# Patient Record
Sex: Female | Born: 1990 | Race: White | Hispanic: No | Marital: Married | Smoking: Never smoker
Health system: Southern US, Community
[De-identification: ages and names within clinical notes are randomized; demographics above are authoritative.]

## PROBLEM LIST (undated history)

## (undated) DIAGNOSIS — Z789 Other specified health status: Secondary | ICD-10-CM

## (undated) HISTORY — PX: NO PAST SURGERIES: SHX2092

---

## 2018-07-13 ENCOUNTER — Encounter: Payer: Self-pay | Admitting: Certified Nurse Midwife

## 2018-07-13 ENCOUNTER — Ambulatory Visit (INDEPENDENT_AMBULATORY_CARE_PROVIDER_SITE_OTHER): Payer: BLUE CROSS/BLUE SHIELD | Admitting: Certified Nurse Midwife

## 2018-07-13 VITALS — BP 102/59 | HR 68 | Ht 68.0 in | Wt 129.1 lb

## 2018-07-13 DIAGNOSIS — Z3041 Encounter for surveillance of contraceptive pills: Secondary | ICD-10-CM | POA: Diagnosis not present

## 2018-07-13 DIAGNOSIS — Z01419 Encounter for gynecological examination (general) (routine) without abnormal findings: Secondary | ICD-10-CM | POA: Diagnosis not present

## 2018-07-13 MED ORDER — OCELLA 3-0.03 MG PO TABS: 1.0000 | ORAL_TABLET | Freq: Every day | ORAL | 4 refills | Status: DC

## 2018-07-13 NOTE — Patient Instructions (Addendum)
Preparing for Pregnancy If you are considering becoming pregnant, make an appointment to see your regular health care provider to learn how to prepare for a safe and healthy pregnancy (preconception care). During a preconception care visit, your health care provider will:  Do a complete physical exam, including a Pap test.  Take a complete medical history.  Give you information, answer your questions, and help you resolve problems.  Preconception checklist Medical history  Tell your health care provider about any current or past medical conditions. Your pregnancy or your ability to become pregnant may be affected by chronic conditions, such as diabetes, chronic hypertension, and thyroid problems.  Include your family's medical history as well as your partner's medical history.  Tell your health care provider about any history of STIs (sexually transmitted infections).These can affect your pregnancy. In some cases, they can be passed to your baby. Discuss any concerns that you have about STIs.  If indicated, discuss the benefits of genetic testing. This testing will show whether there are any genetic conditions that may be passed from you or your partner to your baby.  Tell your health care provider about: ? Any problems you have had with conception or pregnancy. ? Any medicines you take. These include vitamins, herbal supplements, and over-the-counter medicines. ? Your history of immunizations. Discuss any vaccinations that you may need.  Diet  Ask your health care provider what to include in a healthy diet that has a balance of nutrients. This is especially important when you are pregnant or preparing to become pregnant.  Ask your health care provider to help you reach a healthy weight before pregnancy. ? If you are overweight, you may be at higher risk for certain complications, such as high blood pressure, diabetes, and preterm birth. ? If you are underweight, you are more likely  to have a baby who has a low birth weight.  Lifestyle, work, and home  Let your health care provider know: ? About any lifestyle habits that you have, such as alcohol use, drug use, or smoking. ? About recreational activities that may put you at risk during pregnancy, such as downhill skiing and certain exercise programs. ? Tell your health care provider about any international travel, especially any travel to places with an active [place] virus outbreak. ? About harmful substances that you may be exposed to at work or at home. These include chemicals, pesticides, radiation, or even litter boxes. ? If you do not feel safe at home.  Mental health  Tell your health care provider about: ? Any history of mental health conditions, including feelings of depression, sadness, or anxiety. ? Any medicines that you take for a mental health condition. These include herbs and supplements.  Home instructions to prepare for pregnancy Lifestyle  Eat a balanced diet. This includes fresh fruits and vegetables, whole grains, lean meats, low-fat dairy products, healthy fats, and foods that are high in fiber. Ask to meet with a nutritionist or registered dietitian for assistance with meal planning and goals.  Get regular exercise. Try to be active for at least 30 minutes a day on most days of the week. Ask your health care provider which activities are safe during pregnancy.  Do not use any products that contain nicotine or tobacco, such as cigarettes and e-cigarettes. If you need help quitting, ask your health care provider.  Do not drink alcohol.  Do not take illegal drugs.  Maintain a healthy weight. Ask your health care provider what weight range is  right for you.  General instructions  Keep an accurate record of your menstrual periods. This makes it easier for your health care provider to determine your baby's due date.  Begin taking prenatal vitamins and folic acid supplements daily as directed by  your health care provider.  Manage any chronic conditions, such as high blood pressure and diabetes, as told by your health care provider. This is important.  How do I know that I am pregnant? You may be pregnant if you have been sexually active and you miss your period. Symptoms of early pregnancy include:  Mild cramping.  Very light vaginal bleeding (spotting).  Feeling unusually tired.  Nausea and vomiting (morning sickness).  If you have any of these symptoms and you suspect that you might be pregnant, you can take a home pregnancy test. These tests check for a hormone in your urine (human chorionic gonadotropin, or hCG). A woman's body begins to make this hormone during early pregnancy. These tests are very accurate. Wait until at least the first day after you miss your period to take one. If the test shows that you are pregnant (you get a positive result), call your health care provider to make an appointment for prenatal care. What should I do if I become pregnant?  Make an appointment with your health care provider as soon as you suspect you are pregnant.  Do not use any products that contain nicotine, such as cigarettes, chewing tobacco, and e-cigarettes. If you need help quitting, ask your health care provider.  Do not drink alcoholic beverages. Alcohol is related to a number of birth defects.  Avoid toxic odors and chemicals.  You may continue to have sexual intercourse if it does not cause pain or other problems, such as vaginal bleeding. This information is not intended to replace advice given to you by your health care provider. Make sure you discuss any questions you have with your health care provider. Document Released: 11/17/2008 Document Revised: 08/02/2016 Document Reviewed: 06/26/2016 Elsevier Interactive Patient Education  2018 Elsevier Inc.  Preventive Care 18-39 Years, Female Preventive care refers to lifestyle choices and visits with your health care provider  that can promote health and wellness. What does preventive care include?  A yearly physical exam. This is also called an annual well check.  Dental exams once or twice a year.  Routine eye exams. Ask your health care provider how often you should have your eyes checked.  Personal lifestyle choices, including: ? Daily care of your teeth and gums. ? Regular physical activity. ? Eating a healthy diet. ? Avoiding tobacco and drug use. ? Limiting alcohol use. ? Practicing safe sex. ? Taking vitamin and mineral supplements as recommended by your health care provider. What happens during an annual well check? The services and screenings done by your health care provider during your annual well check will depend on your age, overall health, lifestyle risk factors, and family history of disease. Counseling Your health care provider may ask you questions about your:  Alcohol use.  Tobacco use.  Drug use.  Emotional well-being.  Home and relationship well-being.  Sexual activity.  Eating habits.  Work and work environment.  Method of birth control.  Menstrual cycle.  Pregnancy history.  Screening You may have the following tests or measurements:  Height, weight, and BMI.  Diabetes screening. This is done by checking your blood sugar (glucose) after you have not eaten for a while (fasting).  Blood pressure.  Lipid and cholesterol levels. These   be checked every 5 years starting at age 61.  Skin check.  Hepatitis C blood test.  Hepatitis B blood test.  Sexually transmitted disease (STD) testing.  BRCA-related cancer screening. This may be done if you have a family history of breast, ovarian, tubal, or peritoneal cancers.  Pelvic exam and Pap test. This may be done every 3 years starting at age 53. Starting at age 5, this may be done every 5 years if you have a Pap test in combination with an HPV test.  Discuss your test results, treatment options, and if  necessary, the need for more tests with your health care provider. Vaccines Your health care provider may recommend certain vaccines, such as:  Influenza vaccine. This is recommended every year.  Tetanus, diphtheria, and acellular pertussis (Tdap, Td) vaccine. You may need a Td booster every 10 years.  Varicella vaccine. You may need this if you have not been vaccinated.  HPV vaccine. If you are 24 or younger, you may need three doses over 6 months.  Measles, mumps, and rubella (MMR) vaccine. You may need at least one dose of MMR. You may also need a second dose.  Pneumococcal 13-valent conjugate (PCV13) vaccine. You may need this if you have certain conditions and were not previously vaccinated.  Pneumococcal polysaccharide (PPSV23) vaccine. You may need one or two doses if you smoke cigarettes or if you have certain conditions.  Meningococcal vaccine. One dose is recommended if you are age 89-21 years and a first-year college student living in a residence hall, or if you have one of several medical conditions. You may also need additional booster doses.  Hepatitis A vaccine. You may need this if you have certain conditions or if you travel or work in places where you may be exposed to hepatitis A.  Hepatitis B vaccine. You may need this if you have certain conditions or if you travel or work in places where you may be exposed to hepatitis B.  Haemophilus influenzae type b (Hib) vaccine. You may need this if you have certain risk factors.  Talk to your health care provider about which screenings and vaccines you need and how often you need them. This information is not intended to replace advice given to you by your health care provider. Make sure you discuss any questions you have with your health care provider. Document Released: 01/31/2002 Document Revised: 08/24/2016 Document Reviewed: 10/06/2015 Elsevier Interactive Patient Education  2018 [place]; Ethinyl  Estradiol tablets What is this medicine? DROSPIRENONE; ETHINYL ESTRADIOL (dro SPY re nown; ETH in il es tra DYE ole) is an oral contraceptive (birth control pill). This medicine combines two types of female hormones, an estrogen and a progestin. It is used to prevent ovulation and pregnancy. This medicine may be used for other purposes; ask your health care provider or pharmacist if you have questions. COMMON BRAND NAME(S): Glenis Smoker What should I tell my health care provider before I take this medicine? They need to know if you have or ever had any of these conditions: -abnormal vaginal bleeding -adrenal gland disease -blood vessel disease or blood clots -breast, cervical, endometrial, ovarian, liver, or uterine cancer -diabetes -gallbladder disease -heart disease or recent heart attack -high blood pressure -high cholesterol -high potassium level -kidney disease -liver disease -migraine headaches -stroke -systemic lupus erythematosus (SLE) -tobacco smoker -an unusual or allergic reaction to estrogens, progestins, or other medicines, foods, dyes, or preservatives -pregnant or trying  to get pregnant -breast-feeding How should I use this medicine? Take this medicine by mouth. To reduce nausea, this medicine may be taken with food. Follow the directions on the prescription label. Take this medicine at the same time each day and in the order directed on the package. Do not take your medicine more often than directed. A patient package insert for the product will be given with each prescription and refill. Read this sheet carefully each time. The sheet may change frequently. Talk to your pediatrician regarding the use of this medicine in children. Special care may be needed. This medicine has been used in female children who have started having menstrual periods. Overdosage: If you think you have taken too much of this medicine  contact a poison control center or emergency room at once. NOTE: This medicine is only for you. Do not share this medicine with others. What if I miss a dose? If you miss a dose, refer to the patient information sheet you received with your medicine for direction. If you miss more than one pill, this medicine may not be as effective and you may need to use another form of birth control. What may interact with this medicine? Do not take this medicine with any of the following medications: -aminoglutethimide -amprenavir, fosamprenavir -atazanavir; cobicistat -anastrozole -bosentan -exemestane -letrozole -metyrapone -testolactone This medicine may also interact with the following medications: -acetaminophen -antiviral medicines for HIV or AIDS -aprepitant -barbiturates -certain antibiotics like rifampin, rifabutin, rifapentine, and possibly penicillins or tetracyclines -certain diuretics like amiloride, spironolactone, triamterene -certain medicines for fungal infections like griseofulvin, ketoconazole, itraconazole -certain medications for high blood pressure or heart conditions like ACE-inhibitors, Angiotensin-II receptor blockers, eplerenone -certain medicines for seizures like carbamazepine, oxcarbazepine, phenobarbital, phenytoin -cholestyramine -cobicistat -corticosteroid like hydrocortisone and prednisolone -cyclosporine -dantrolene -felbamate -grapefruit juice -heparin -lamotrigine -medicines for diabetes, including pioglitazone -modafinil -NSAIDs -potassium supplements -pyrimethamine -raloxifene -St. John's wort -sulfasalazine -tamoxifen -topiramate -thyroid hormones -warfarin his list may not describe all possible interactions. Give your health care provider a list of all the medicines, herbs, non-prescription drugs, or dietary supplements you use. Also tell them if you smoke, drink alcohol, or use illegal drugs. Some items may interact with your medicine. This  list may not describe all possible interactions. Give your health care provider a list of all the medicines, herbs, non-prescription drugs, or dietary supplements you use. Also tell them if you smoke, drink alcohol, or use illegal drugs. Some items may interact with your medicine. What should I watch for while using this medicine? Visit your doctor or health care professional for regular checks on your progress. You will need a regular breast and pelvic exam and Pap smear while on this medicine. Use an additional method of contraception during the first cycle that you take these tablets. If you have any reason to think you are pregnant, stop taking this medicine right away and contact your doctor or health care professional. If you are taking this medicine for hormone related problems, it may take several cycles of use to see improvement in your condition. Smoking increases the risk of getting a blood clot or having a stroke while you are taking birth control pills, especially if you are more than 26 years old. You are strongly advised not to smoke. This medicine can make your body retain fluid, making your fingers, hands, or ankles swell. Your blood pressure can go up. Contact your doctor or health care professional if you feel you are retaining fluid. This medicine can make you  more sensitive to the sun. Keep out of the sun. If you cannot avoid being in the sun, wear protective clothing and use sunscreen. Do not use sun lamps or tanning beds/booths. If you wear contact lenses and notice visual changes, or if the lenses begin to feel uncomfortable, consult your eye care specialist. In some women, tenderness, swelling, or minor bleeding of the gums may occur. Notify your dentist if this happens. Brushing and flossing your teeth regularly may help limit this. See your dentist regularly and inform your dentist of the medicines you are taking. If you are going to have elective surgery, you may need to stop  taking this medicine before the surgery. Consult your health care professional for advice. This medicine does not protect you against HIV infection (AIDS) or any other sexually transmitted diseases. What side effects may I notice from receiving this medicine? Side effects that you should report to your doctor or health care professional as soon as possible: -allergic reactions like skin rash, itching or hives, swelling of the face, lips, or tongue -breast tissue changes or discharge -changes in vision -chest pain -confusion, trouble speaking or understanding -dark urine -general ill feeling or flu-like symptoms -light-colored stools -nausea, vomiting -pain, swelling, warmth in the leg -right upper belly pain -severe headaches -shortness of breath -sudden numbness or weakness of the face, arm or leg -trouble walking, dizziness, loss of balance or coordination -unusual vaginal bleeding -yellowing of the eyes or skin Side effects that usually do not require medical attention (report to your doctor or health care professional if they continue or are bothersome): -acne -brown spots on the face -change in appetite -change in sexual desire -depressed mood or mood swings -fluid retention and swelling -stomach cramps or bloating -unusually weak or tired -weight gain This list may not describe all possible side effects. Call your doctor for medical advice about side effects. You may report side effects to FDA at 1-800-FDA-1088. Where should I keep my medicine? Keep out of the reach of children. Store at room temperature between 15 and 30 degrees C (59 and 86 degrees F). Throw away any unused medicine after the expiration date. NOTE: This sheet is a summary. It may not cover all possible information. If you have questions about this medicine, talk to your doctor, pharmacist, or health care provider.  2018 Elsevier/Gold Standard (2016-08-26 13:52:56)

## 2018-07-13 NOTE — Progress Notes (Signed)
ANNUAL PREVENTATIVE CARE GYN  ENCOUNTER NOTE  Subjective:       Wanda Sanchez is a 27 y.o. [id number] female here for a routine annual gynecologic exam. No current complaints. Here to establish care.   [place]ocated from [place] with husband and one (531) year old daughter. Works at [employer].   Denies difficulty breathing or respiratory distress, chest pain, abdominal pain, excessive vaginal bleeding, dysuria and leg pain or swelling.     Gynecologic History  Patient's last menstrual period was 06/30/2018 (exact date). Period Cycle (Days): 28 Period Duration (Days): Five (5) Period Pattern: Regular Menstrual Flow: Moderate Menstrual Control: Tampon Menstrual Control Change Freq (Hours): Four (4) to five (5) Dysmenorrhea: None  Contraception: OCP (estrogen/progesterone)  Last Pap: 05/2017. Results were: normal  Obstetric History  OB History  Gravida Para Term Preterm AB Living  1 1 1     1   SAB TAB Ectopic Multiple Live Births          1    # Outcome Date GA Lbr Len/2nd Weight Sex Delivery Anes PTL Lv  1 Term 2017    F Vag-Spont  N LIV   No Known Allergies  Social History   Socioeconomic History  . Marital status: Married    Spouse name: Not on file  . Number of children: Not on file  . Years of education: Not on file  . Highest education level: Not on file  Occupational History  . Not on file  Social Needs  . Financial resource strain: Not on file  . Food insecurity:    Worry: Not on file    Inability: Not on file  . Transportation needs:    Medical: Not on file    Non-medical: Not on file  Tobacco Use  . Smoking status: Never Smoker  . Smokeless tobacco: Never Used  Substance and Sexual Activity  . Alcohol use: Yes    Comment: occas  . Drug use: Never  . Sexual activity: Yes    Birth control/protection: Pill  Lifestyle  . Physical activity:    Days per week: Not on file    Minutes per session: Not on file  . Stress: Not on file   Relationships  . Social connections:    Talks on phone: Not on file    Gets together: Not on file    Attends religious service: Not on file    Active member of club or organization: Not on file    Attends meetings of clubs or organizations: Not on file    Relationship status: Not on file  . Intimate partner violence:    Fear of current or ex partner: Not on file    Emotionally abused: Not on file    Physically abused: Not on file    Forced sexual activity: Not on file  Other Topics Concern  . Not on file  Social History Narrative  . Not on file    Family History  Problem Relation Age of Onset  . Breast cancer Maternal Aunt   . Breast cancer Paternal Aunt   . Cancer Maternal Grandfather     The following portions of the patient's history were reviewed and updated as appropriate: allergies, current medications, past family history, past medical history, past social history, past surgical history and problem list.  Review of Systems  ROS negative except as noted above. Information obtained from patient.    Objective:   BP (!) 102/59   Pulse 68   Ht 5'  8" (1.727 m)   Wt 129 lb 1 oz (58.5 kg)   LMP 06/30/2018 (Exact Date)   BMI 19.62 kg/m    CONSTITUTIONAL: Well-developed, well-nourished female in no acute distress.   PSYCHIATRIC: Normal mood and affect. Normal behavior. Normal judgment and thought content.  NEUROLGIC: Alert and oriented to person, place, and time. Normal muscle tone coordination. No cranial nerve deficit noted.  HENT:  Normocephalic, atraumatic, External right and left ear normal.   EYES: Conjunctivae and EOM are normal. Pupils are equal and round.    NECK: Normal range of motion, supple, no masses.  Normal thyroid.   SKIN: Skin is warm and dry. No rash noted. Not diaphoretic. No erythema. No pallor.  CARDIOVASCULAR: Normal heart rate noted, regular  rhythm, no murmur.  RESPIRATORY: Clear to auscultation bilaterally. Effort and breath sounds  normal, no problems with respiration noted.  BREASTS: Symmetric in size. No masses, skin changes, nipple drainage, or lymphadenopathy.  ABDOMEN: Soft, normal bowel sounds, no distention noted.  No tenderness, rebound or guarding.   PELVIC:  External Genitalia: Normal  Vagina: Normal  Cervix: Normal  Uterus: Normal  Adnexa: Normal  MUSCULOSKELETAL: Normal range of motion. No tenderness.  No cyanosis, clubbing, or edema.  2+ distal pulses.  LYMPHATIC: No Axillary, Supraclavicular, or Inguinal  Adenopathy.  Assessment:   Annual gynecologic examination 27 y.o.   Contraception: OCP (estrogen/progesterone)   Normal BMI   Problem List Items Addressed This Visit    None    Visit Diagnoses    Well woman exam    -  Primary      Plan:   Pap: Not needed  Labs: Declined  Routine preventative health maintenance measures emphasized: Exercise/Diet/Weight control, Tobacco Warnings, Alcohol/Substance use risks, Stress Management and Peer Pressure Issues; see AVS  Rx: Ocella, see orders  Reviewed red flag symptoms and when to call  RTC x 1 year for Annual Exam or sooner if needed   Gunnar Bulla, CNM [facility], [facility]

## 2018-07-13 NOTE — Progress Notes (Signed)
New pt is here for an annual exam. LPS 2018 WNL.

## 2018-07-16 DIAGNOSIS — Z789 Other specified health status: Secondary | ICD-10-CM | POA: Insufficient documentation

## 2018-07-31 ENCOUNTER — Ambulatory Visit (INDEPENDENT_AMBULATORY_CARE_PROVIDER_SITE_OTHER): Payer: BLUE CROSS/BLUE SHIELD | Admitting: Certified Nurse Midwife

## 2018-07-31 VITALS — BP 110/61 | HR 85 | Ht 68.0 in | Wt 129.3 lb

## 2018-07-31 DIAGNOSIS — F3289 Other specified depressive episodes: Secondary | ICD-10-CM

## 2018-07-31 NOTE — Patient Instructions (Signed)
Ethinyl Estradiol; Norethindrone Acetate; Ferrous fumarate tablets or capsules What is this medicine? ETHINYL ESTRADIOL; NORETHINDRONE ACETATE; FERROUS FUMARATE (ETH in il es tra DYE ole; nor eth IN drone AS e tate; FER us FUE ma rate) is an oral contraceptive. The products combine two types of female hormones, an estrogen and a progestin. They are used to prevent ovulation and pregnancy. Some products are also used to treat acne in females. This medicine may be used for other purposes; ask your health care provider or pharmacist if you have questions. COMMON BRAND NAME(S): Blisovi 24 Fe, Blisovi Fe, Estrostep Fe, Gildess 24 Fe, Gildess Fe 1.5/30, Gildess Fe 1/20, Junel Fe 1.5/30, Junel Fe 1/20, Junel Fe 24, Larin Fe, Lo Loestrin Fe, Loestrin 24 Fe, Loestrin FE 1.5/30, Loestrin FE 1/20, Lomedia 24 Fe, Microgestin 24 Fe, Microgestin Fe 1.5/30, Microgestin Fe 1/20, Tarina Fe 1/20, Taytulla, Tilia Fe, Tri-Legest Fe What should I tell my health care provider before I take this medicine? They need to know if you have any of these conditions: -abnormal vaginal bleeding -blood vessel disease -breast, cervical, endometrial, ovarian, liver, or uterine cancer -diabetes -gallbladder disease -heart disease or recent heart attack -high blood pressure -high cholesterol -history of blood clots -kidney disease -liver disease -migraine headaches -smoke tobacco -stroke -systemic lupus erythematosus (SLE) -an unusual or allergic reaction to estrogens, progestins, other medicines, foods, dyes, or preservatives -pregnant or trying to get pregnant -breast-feeding How should I use this medicine? Take this medicine by mouth. To reduce nausea, this medicine may be taken with food. Follow the directions on the prescription label. Take this medicine at the same time each day and in the order directed on the package. Do not take your medicine more often than directed. A patient package insert for the product will be  given with each prescription and refill. Read this sheet carefully each time. The sheet may change frequently. Contact your pediatrician regarding the use of this medicine in children. Special care may be needed. This medicine has been used in female children who have started having menstrual periods. Overdosage: If you think you have taken too much of this medicine contact a poison control center or emergency room at once. NOTE: This medicine is only for you. Do not share this medicine with others. What if I miss a dose? If you miss a dose, refer to the patient information sheet you received with your medicine for direction. If you miss more than one pill, this medicine may not be as effective and you may need to use another form of birth control. What may interact with this medicine? Do not take this medicine with the following medication: -dasabuvir; ombitasvir; paritaprevir; ritonavir -ombitasvir; paritaprevir; ritonavir This medicine may also interact with the following medications: -acetaminophen -antibiotics or medicines for infections, especially rifampin, rifabutin, rifapentine, and griseofulvin, and possibly penicillins or tetracyclines -aprepitant -ascorbic acid (vitamin C) -atorvastatin -barbiturate medicines, such as phenobarbital -bosentan -carbamazepine -caffeine -clofibrate -cyclosporine -dantrolene -doxercalciferol -felbamate -grapefruit juice -hydrocortisone -medicines for anxiety or sleeping problems, such as diazepam or temazepam -medicines for diabetes, including pioglitazone -mineral oil -modafinil -mycophenolate -nefazodone -oxcarbazepine -phenytoin -prednisolone -ritonavir or other medicines for HIV infection or AIDS -rosuvastatin -selegiline -soy isoflavones supplements -St. John's wort -tamoxifen or raloxifene -theophylline -thyroid hormones -topiramate -warfarin This list may not describe all possible interactions. Give your health care  provider a list of all the medicines, herbs, non-prescription drugs, or dietary supplements you use. Also tell them if you smoke, drink alcohol, or use illegal drugs. Some   items may interact with your medicine. What should I watch for while using this medicine? Visit your doctor or health care professional for regular checks on your progress. You will need a regular breast and pelvic exam and Pap smear while on this medicine. Use an additional method of contraception during the first cycle that you take these tablets. If you have any reason to think you are pregnant, stop taking this medicine right away and contact your doctor or health care professional. If you are taking this medicine for hormone related problems, it may take several cycles of use to see improvement in your condition. Smoking increases the risk of getting a blood clot or having a stroke while you are taking birth control pills, especially if you are more than 26 years old. You are strongly advised not to smoke. This medicine can make your body retain fluid, making your fingers, hands, or ankles swell. Your blood pressure can go up. Contact your doctor or health care professional if you feel you are retaining fluid. This medicine can make you more sensitive to the sun. Keep out of the sun. If you cannot avoid being in the sun, wear protective clothing and use sunscreen. Do not use sun lamps or tanning beds/booths. If you wear contact lenses and notice visual changes, or if the lenses begin to feel uncomfortable, consult your eye care specialist. In some women, tenderness, swelling, or minor bleeding of the gums may occur. Notify your dentist if this happens. Brushing and flossing your teeth regularly may help limit this. See your dentist regularly and inform your dentist of the medicines you are taking. If you are going to have elective surgery, you may need to stop taking this medicine before the surgery. Consult your health care  professional for advice. This medicine does not protect you against HIV infection (AIDS) or any other sexually transmitted diseases. What side effects may I notice from receiving this medicine? Side effects that you should report to your doctor or health care professional as soon as possible: -allergic reactions like skin rash, itching or hives, swelling of the face, lips, or tongue -breast tissue changes or discharge -changes in vaginal bleeding during your period or between your periods -changes in vision -chest pain -confusion -coughing up blood -dizziness -feeling faint or lightheaded -headaches or migraines -leg, arm or groin pain -loss of balance or coordination -severe or sudden headaches -stomach pain (severe) -sudden shortness of breath -sudden numbness or weakness of the face, arm or leg -symptoms of vaginal infection like itching, irritation or unusual discharge -tenderness in the upper abdomen -trouble speaking or understanding -vomiting -yellowing of the eyes or skin Side effects that usually do not require medical attention (report to your doctor or health care professional if they continue or are bothersome): -breakthrough bleeding and spotting that continues beyond the 3 initial cycles of pills -breast tenderness -mood changes, anxiety, depression, frustration, anger, or emotional outbursts -increased sensitivity to sun or ultraviolet light -nausea -skin rash, acne, or brown spots on the skin -weight gain (slight) This list may not describe all possible side effects. Call your doctor for medical advice about side effects. You may report side effects to FDA at 1-800-FDA-1088. Where should I keep my medicine? Keep out of the reach of children. Store at room temperature between 15 and 30 degrees C (59 and 86 degrees F). Throw away any unused medicine after the expiration date. NOTE: This sheet is a summary. It may not cover all possible information. If you   have  questions about this medicine, talk to your doctor, pharmacist, or health care provider.  2018 Elsevier/Gold Standard (2016-08-15 08:04:41) Postpartum Depression and Baby Blues The postpartum period begins right after the birth of a baby. During this time, there is often a great amount of joy and excitement. It is also a time of many changes in the life of the parents. Regardless of how many times a mother gives birth, each child brings new challenges and dynamics to the family. It is not unusual to have feelings of excitement along with confusing shifts in moods, emotions, and thoughts. All mothers are at risk of developing postpartum depression or the "baby blues." These mood changes can occur right after giving birth, or they may occur many months after giving birth. The baby blues or postpartum depression can be mild or severe. Additionally, postpartum depression can go away rather quickly, or it can be a long-term condition. What are the causes? Raised hormone levels and the rapid drop in those levels are thought to be a main cause of postpartum depression and the baby blues. A number of hormones change during and after pregnancy. Estrogen and progesterone usually decrease right after the delivery of your baby. The levels of thyroid hormone and various cortisol steroids also rapidly drop. Other factors that play a role in these mood changes include major life events and genetics. What increases the risk? If you have any of the following risks for the baby blues or postpartum depression, know what symptoms to watch out for during the postpartum period. Risk factors that may increase the likelihood of getting the baby blues or postpartum depression include:  Having a personal or family history of depression.  Having depression while being pregnant.  Having premenstrual mood issues or mood issues related to oral contraceptives.  Having a lot of life stress.  Having marital conflict.  Lacking a  social support network.  Having a baby with special needs.  Having health problems, such as diabetes.  What are the signs or symptoms? Symptoms of baby blues include:  Brief changes in mood, such as going from extreme happiness to sadness.  Decreased concentration.  Difficulty sleeping.  Crying spells, tearfulness.  Irritability.  Anxiety.  Symptoms of postpartum depression typically begin within the first month after giving birth. These symptoms include:  Difficulty sleeping or excessive sleepiness.  Marked weight loss.  Agitation.  Feelings of worthlessness.  Lack of interest in activity or food.  Postpartum psychosis is a very serious condition and can be dangerous. Fortunately, it is rare. Displaying any of the following symptoms is cause for immediate medical attention. Symptoms of postpartum psychosis include:  Hallucinations and delusions.  Bizarre or disorganized behavior.  Confusion or disorientation.  How is this diagnosed? A diagnosis is made by an evaluation of your symptoms. There are no medical or lab tests that lead to a diagnosis, but there are various questionnaires that a health care provider may use to identify those with the baby blues, postpartum depression, or psychosis. Often, a screening tool called the [place] Postnatal Depression Scale is used to diagnose depression in the postpartum period. How is this treated? The baby blues usually goes away on its own in 1-2 weeks. Social support is often all that is needed. You will be encouraged to get adequate sleep and rest. Occasionally, you may be given medicines to help you sleep. Postpartum depression requires treatment because it can last several months or longer if it is not treated. Treatment may include individual  or group therapy, medicine, or both to address any social, physiological, and psychological factors that may play a role in the depression. Regular exercise, a healthy diet, rest, and  social support may also be strongly recommended. Postpartum psychosis is more serious and needs treatment right away. Hospitalization is often needed. Follow these instructions at home:  Get as much rest as you can. Nap when the baby sleeps.  Exercise regularly. Some women find yoga and walking to be beneficial.  Eat a balanced and nourishing diet.  Do little things that you enjoy. Have a cup of tea, take a bubble bath, read your favorite magazine, or listen to your favorite music.  Avoid alcohol.  Ask for help with household chores, cooking, grocery shopping, or running errands as needed. Do not try to do everything.  Talk to people close to you about how you are feeling. Get support from your partner, family members, friends, or other new moms.  Try to stay positive in how you think. Think about the things you are grateful for.  Do not spend a lot of time alone.  Only take over-the-counter or prescription medicine as directed by your health care provider.  Keep all your postpartum appointments.  Let your health care provider know if you have any concerns. Contact a health care provider if: You are having a reaction to or problems with your medicine. Get help right away if:  You have suicidal feelings.  You think you may harm the baby or someone else. This information is not intended to replace advice given to you by your health care provider. Make sure you discuss any questions you have with your health care provider. Document Released: 09/08/2004 Document Revised: 05/12/2016 Document Reviewed: 09/16/2013 Elsevier Interactive Patient Education  2017 [employer]

## 2018-07-31 NOTE — Progress Notes (Signed)
GYN ENCOUNTER NOTE  Subjective:       Wanda Sanchez is a 27 y.o. 441P1001 female is here for evaluation of depression. Accompanied by spouse.   Reports symptoms for the last year and a half, approximately three (3) to four (4) months after having child. Endorses constant sadness, mood swings, and decreased active. Feels "like a rain cloud is hovering over". Distracted at work. Sleeping six (6) to eight (8) hours a night.   Recent stressors including spouses work schedule, relocation, and living with her parents. Questions if mood changes could be related to current dose of OCP.   No SI/HI. Denies difficulty breathing or respiratory distress, chest pain, abdominal pain, excessive vaginal bleeding, dysuria, and leg pain or swelling.    Gynecologic History  Patient's last menstrual period was 07/28/2018 (exact date).  Contraception: OCP (estrogen/progesterone)  Last Pap: 05/2017. Results were: normal  Obstetric History  OB History  Gravida Para Term Preterm AB Living  1 1 1     1   SAB TAB Ectopic Multiple Live Births          1    # Outcome Date GA Lbr Len/2nd Weight Sex Delivery Anes PTL Lv  1 Term 2017    F Vag-Spont  N LIV    No past medical history on file.   Current Outpatient Medications on File Prior to Visit  Medication Sig Dispense Refill  . OCELLA 3-0.03 MG tablet Take 1 tablet by mouth daily. 3 Package 4   No current facility-administered medications on file prior to visit.     No Known Allergies  Social History   Socioeconomic History  . Marital status: Married    Spouse name: Not on file  . Number of children: Not on file  . Years of education: Not on file  . Highest education level: Not on file  Occupational History  . Not on file  Social Needs  . Financial resource strain: Not on file  . Food insecurity:    Worry: Not on file    Inability: Not on file  . Transportation needs:    Medical: Not on file    Non-medical: Not on file  Tobacco Use   . Smoking status: Never Smoker  . Smokeless tobacco: Never Used  Substance and Sexual Activity  . Alcohol use: Yes    Comment: occas  . Drug use: Never  . Sexual activity: Yes    Birth control/protection: Pill  Lifestyle  . Physical activity:    Days per week: Not on file    Minutes per session: Not on file  . Stress: Not on file  Relationships  . Social connections:    Talks on phone: Not on file    Gets together: Not on file    Attends religious service: Not on file    Active member of club or organization: Not on file    Attends meetings of clubs or organizations: Not on file    Relationship status: Not on file  . Intimate partner violence:    Fear of current or ex partner: Not on file    Emotionally abused: Not on file    Physically abused: Not on file    Forced sexual activity: Not on file  Other Topics Concern  . Not on file  Social History Narrative  . Not on file    Family History  Problem Relation Age of Onset  . Breast cancer Maternal Aunt   . Breast cancer Paternal Aunt   .  Cancer Maternal Grandfather     The following portions of the patient's history were reviewed and updated as appropriate: allergies, current medications, past family history, past medical history, past social history, past surgical history and problem list.  Review of Systems  ROS negative except as noted above. Information obtained from patient.   Objective:   BP 110/61   Pulse 85   Ht 5\' 8"  (1.727 m)   Wt 129 lb 5 oz (58.7 kg)   LMP 07/28/2018 (Exact Date)   BMI 19.66 kg/m  CONSTITUTIONAL: Well-developed, well-nourished female in no acute distress.   PHYSICAL EXAM: not indicated.     [facility]  PHQ-9 Total Score  5      Assessment:   1. Other depression  - Ambulatory referral to Behavioral Health   Plan:   Discussed options for care including support and watchful waiting, counseling, or antidepressant.   Desires  counseling at this time.   [facility], [facility] orders.   Sample of Lo Loestrin given along with discount card.   Reviewed red flag symptoms and when to call.   RTC x 4-6 weeks for follow up or sooner if needed.    Gunnar BullaJenkins Michelle Regino Fournet, CNM [facility]

## 2018-07-31 NOTE — Progress Notes (Signed)
Pt is here with concerns of depression.. Screening 5

## 2018-08-11 ENCOUNTER — Other Ambulatory Visit: Payer: Self-pay

## 2018-08-11 ENCOUNTER — Ambulatory Visit
Admission: EM | Admit: 2018-08-11 | Discharge: 2018-08-11 | Disposition: A | Discharge status: Home / Self Care | Payer: BLUE CROSS/BLUE SHIELD | Attending: Family Medicine | Admitting: Family Medicine

## 2018-08-11 DIAGNOSIS — R300 Dysuria: Secondary | ICD-10-CM

## 2018-08-11 DIAGNOSIS — N3001 Acute cystitis with hematuria: Secondary | ICD-10-CM

## 2018-08-11 DIAGNOSIS — R350 Frequency of micturition: Secondary | ICD-10-CM

## 2018-08-11 LAB — URINALYSIS, COMPLETE (UACMP) WITH MICROSCOPIC
BILIRUBIN URINE: NEGATIVE
Glucose, UA: NEGATIVE mg/dL
KETONES UR: NEGATIVE mg/dL
NITRITE: POSITIVE — AB
PH: 6.5 (ref 5.0–8.0)
Protein, ur: >300 mg/dL — AB
Specific Gravity, Urine: >1.03 — ABNORMAL HIGH (ref 1.005–1.030)

## 2018-08-11 MED ORDER — NITROFURANTOIN MONOHYD MACRO 100 MG PO CAPS: 100.0000 mg | ORAL_CAPSULE | Freq: Two times a day (BID) | ORAL | 0 refills | Status: DC

## 2018-08-11 NOTE — Discharge Instructions (Signed)
Your UA today revealed a urinary infection. Take full course of antibiotics. Symptoms should begin to improve over the next 24-72 hours. Increase fluid intake and rest. F/u if symptoms do not begin to improve or if they worsens. Seek re-examination for worsening lower back pain, abdominal pain, N/V, fever.

## 2018-08-11 NOTE — ED Triage Notes (Signed)
Pt reports 5 days of frequency of urination and now with mild right flank pain

## 2018-08-11 NOTE — ED Provider Notes (Signed)
[facility]    CSN: [id number] Arrival date & time: 08/11/18  1236     History   Chief Complaint Chief Complaint  Patient presents with  . Urinary Frequency    HPI Wanda Sanchez is a 27 y.o. female. Patient presents with 5 days history of painful urination, urgency and frequency. Admits to seeing some blood in urine. States she was taking AZO with mild improvement in symptoms until today. Began having lower right sided back pain today. Denies fever, N/V, abdominal pain, vaginal complaints and has no other concerns today.  HPI  History reviewed. No pertinent past medical history.  Patient Active Problem List   Diagnosis Date Noted  . Encounter for birth control pills maintenance 07/16/2018    History reviewed. No pertinent surgical history.  OB History    Gravida  1   Para  1   Term  1   Preterm      AB      Living  1     SAB      TAB      Ectopic      Multiple      Live Births  1            Home Medications    Prior to Admission medications   Medication Sig Start Date End Date Taking? Authorizing Provider  norethindrone-ethinyl estradiol (JUNEL FE,GILDESS FE,LOESTRIN FE) 1-20 MG-MCG tablet Take 1 tablet by mouth daily.   Yes [provider]  OCELLA 3-0.03 MG tablet Take 1 tablet by mouth daily. 07/13/18   Lawhorn, Vanessa [place], CNM    Family History Family History  Problem Relation Age of Onset  . Breast cancer Maternal Aunt   . Breast cancer Paternal Aunt   . Cancer Maternal Grandfather     Social History Social History   Tobacco Use  . Smoking status: Never Smoker  . Smokeless tobacco: Never Used  Substance Use Topics  . Alcohol use: Yes    Comment: occas  . Drug use: Never     Allergies   Patient has no known allergies.   Review of Systems Review of Systems  Constitutional: Negative for chills, fatigue and fever.  Gastrointestinal: Negative for abdominal pain, diarrhea, nausea and vomiting.    Genitourinary: Positive for difficulty urinating, dysuria, flank pain (right flank pain that is mild), frequency and urgency. Negative for pelvic pain, vaginal bleeding, vaginal discharge and vaginal pain.  Musculoskeletal: Positive for back pain (lower right back pain that is mild).  Skin: Negative for rash.  Neurological: Negative for weakness.     Physical Exam Triage Vital Signs ED Triage Vitals  Enc Vitals Group     BP 08/11/18 1245 109/76     Pulse Rate 08/11/18 1245 69     Resp 08/11/18 1245 16     Temp 08/11/18 1245 97.6 F (36.4 C)     Temp Source 08/11/18 1245 Oral     SpO2 08/11/18 1245 100 %     Weight 08/11/18 1247 129 lb 6.6 oz (58.7 kg)     Height 08/11/18 1247 5\' 8"  (1.727 m)     Head Circumference --      Peak Flow --      Pain Score 08/11/18 1247 2     Pain Loc --      Pain Edu? --      Excl. in GC? --    No data found.  Updated Vital Signs BP 109/76 (BP Location: Left Arm)  Pulse 69   Temp 97.6 F (36.4 C) (Oral)   Resp 16   Ht $RemoveBefor eDEID_TkkhumWBmfrhRWQHQmnoSWSFvDeJFWEv$5\' 8"0/2019 (Exact Date)   SpO2 100%   BMI 19.68 kg/m      Physical Exam  Constitutional: She is oriented to person, place, and time. She appears well-developed and well-nourished.  HENT:  Head: Normocephalic and atraumatic.  Eyes: Pupils are equal, round, and reactive to light. Conjunctivae are normal. No scleral icterus.  Cardiovascular: Normal rate, regular rhythm and normal heart sounds.  Pulmonary/Chest: Effort normal and breath sounds normal. She has no wheezes. She has no rales.  Abdominal: Soft. There is tenderness (mild right CVA tenderness).  Neurological: She is alert and oriented to person, place, and time.  Skin: Skin is warm. No rash noted. No erythema.  Psychiatric: She has a normal mood and affect. Her behavior is normal.     UC Treatments / Results  Labs (all labs ordered are listed, but only abnormal results are displayed) Labs Reviewed   URINALYSIS, COMPLETE (UACMP) WITH MICROSCOPIC - Abnormal; Notable for the following components:      Result Value   APPearance CLOUDY (*)    Specific Gravity, Urine >1.030 (*)    Hgb urine dipstick LARGE (*)    Protein, ur >300 (*)    Nitrite POSITIVE (*)    Leukocytes, UA LARGE (*)    Bacteria, UA MANY (*)    All other components within normal limits    EKG None  Radiology No results found.  Procedures Procedures (including critical care time)  Medications Ordered in UC Medications - No data to display  Initial Impression / Assessment and Plan / UC Course  I have reviewed the triage vital signs and the nursing notes.  Pertinent labs & imaging results that were available during my care of the patient were reviewed by me and considered in my medical decision making (see chart for details).   UA today revealed +leuk, +nit, +blood, and +bacteria. Will send for C and S and treat with Macrobid at this time. Increase rest and fluid intake. F/u with PCP in 2-3 days if not feeling better. Go to ER for signs of ascending infection (discussed red flags in detail with patient)  Final Clinical Impressions(s) / UC Diagnoses   Final diagnoses:  None   Discharge Instructions   None    ED Prescriptions    None     Controlled Substance Prescriptions [place] Controlled Substance Registry consulted? Not Applicable   Gareth Morganaves, Delina Kruczek B, PA-C 08/11/18 1443

## 2018-08-13 ENCOUNTER — Telehealth (HOSPITAL_COMMUNITY): Payer: Self-pay

## 2018-08-13 LAB — URINE CULTURE: Culture: >=100000 — AB

## 2018-08-13 NOTE — Telephone Encounter (Signed)
Urine culture positive for E.coli. This was treated with Macrobid at ucc visit. Attempted to reach patient no answer at this time.

## 2018-09-03 ENCOUNTER — Encounter: Payer: Self-pay | Admitting: Certified Nurse Midwife

## 2018-09-03 ENCOUNTER — Ambulatory Visit (INDEPENDENT_AMBULATORY_CARE_PROVIDER_SITE_OTHER): Payer: BLUE CROSS/BLUE SHIELD | Admitting: Certified Nurse Midwife

## 2018-09-03 VITALS — BP 100/63 | HR 80 | Ht 68.0 in | Wt 130.8 lb

## 2018-09-03 DIAGNOSIS — F329 Major depressive disorder, single episode, unspecified: Secondary | ICD-10-CM

## 2018-09-03 DIAGNOSIS — F32A Depression, unspecified: Secondary | ICD-10-CM | POA: Insufficient documentation

## 2018-09-03 MED ORDER — NORETHIN-ETH ESTRAD-FE BIPHAS 1 MG-10 MCG / 10 MCG PO TABS
1.0000 | ORAL_TABLET | Freq: Every day | ORAL | 4 refills | Status: DC
Start: 2018-09-03 — End: 2019-02-12

## 2018-09-03 NOTE — Progress Notes (Signed)
GYN ENCOUNTER NOTE  Subjective:       Wanda Sanchez is a 27 y.o. [id number] female here for follow up regarding depression.   Switched to Lo Loestrin on 07/31/2018. Patient started counseling and is "feeling better". Spouse is helping more. Baby is starting daycare.   Notes "cloud is present", but not as prominent. Would like to continue current plan.   Denies difficulty breathing or respiratory distress, chest pain, abdominal pain, excessive vaginal bleeding, dysuria, and leg pain or swelling.    Gynecologic History  Patient's last menstrual period was 08/17/2018.  Contraception: OCP (estrogen/progesterone)  Last Pap: 05/2017. Results were: normal  Obstetric History  OB History  Gravida Para Term Preterm AB Living  1 1 1     1   SAB TAB Ectopic Multiple Live Births          1    # Outcome Date GA Lbr Len/2nd Weight Sex Delivery Anes PTL Lv  1 Term 2017    F Vag-Spont  N LIV    History reviewed. No pertinent past medical history.  History reviewed. No pertinent surgical history.  Current Outpatient Medications on File Prior to Visit  Medication Sig Dispense Refill  . norethindrone-ethinyl estradiol (JUNEL FE,GILDESS FE,LOESTRIN FE) 1-20 MG-MCG tablet Take 1 tablet by mouth daily.     No current facility-administered medications on file prior to visit.     No Known Allergies  Social History   Socioeconomic History  . Marital status: Married    Spouse name: Not on file  . Number of children: Not on file  . Years of education: Not on file  . Highest education level: Not on file  Occupational History  . Not on file  Social Needs  . Financial resource strain: Not on file  . Food insecurity:    Worry: Not on file    Inability: Not on file  . Transportation needs:    Medical: Not on file    Non-medical: Not on file  Tobacco Use  . Smoking status: Never Smoker  . Smokeless tobacco: Never Used  Substance and Sexual Activity  . Alcohol use: Yes    Comment:  occas  . Drug use: Never  . Sexual activity: Yes    Birth control/protection: Pill  Lifestyle  . Physical activity:    Days per week: Not on file    Minutes per session: Not on file  . Stress: Not on file  Relationships  . Social connections:    Talks on phone: Not on file    Gets together: Not on file    Attends religious service: Not on file    Active member of club or organization: Not on file    Attends meetings of clubs or organizations: Not on file    Relationship status: Not on file  . Intimate partner violence:    Fear of current or ex partner: Not on file    Emotionally abused: Not on file    Physically abused: Not on file    Forced sexual activity: Not on file  Other Topics Concern  . Not on file  Social History Narrative  . Not on file    Family History  Problem Relation Age of Onset  . Breast cancer Maternal Aunt   . Breast cancer Paternal Aunt   . Cancer Maternal Grandfather   . Healthy Mother   . Healthy Father     The following portions of the patient's history were reviewed and updated as appropriate: allergies,  current medications, past family history, past medical history, past social history, past surgical history and problem list.  Review of Systems  ROS negative except as noted above. Information obtained from patient.   Objective:   BP 100/63   Pulse 80   Ht 5\' 8"  (1.727 m)   Wt 130 lb 12.8 oz (59.3 kg)   LMP 08/17/2018   BMI 19.89 kg/m   CONSTITUTIONAL: Well-developed, well-nourished female in no acute distress.   Depression screen [facility] 2/9 09/03/2018 07/31/2018  Decreased Interest 0 1  Down, Depressed, Hopeless 0 1  PHQ - 2 Score 0 2  Altered sleeping 0 0  Tired, decreased energy 1 1  Change in appetite 0 0  Feeling bad or failure about yourself  0 1  Trouble concentrating 0 1  Moving slowly or fidgety/restless 0 0  Suicidal thoughts 0 0  PHQ-9 Score 1 5  Difficult doing work/chores Not difficult at all -     Assessment:    1. Depression, unspecified depression type  Plan:   Rx: Lo Loestrin, see orders.   Continue counseling as previously advised.   Reviewed red flag symptoms and when to call.   RTC x 4-6 weeks for follow up or sooner if needed.    Gunnar Bulla, CNM [facility], [facility]

## 2018-09-03 NOTE — Progress Notes (Signed)
  PT is present today for follow for depression.  PHQ9=1.  Pt stated that she is doing well no complaints.

## 2018-09-03 NOTE — Patient Instructions (Addendum)
Ethinyl Estradiol; Norethindrone Acetate; Ferrous fumarate tablets or capsules What is this medicine? ETHINYL ESTRADIOL; NORETHINDRONE ACETATE; FERROUS FUMARATE (ETH in il es tra DYE ole; nor eth IN drone AS e tate; FER us FUE ma rate) is an oral contraceptive. The products combine two types of female hormones, an estrogen and a progestin. They are used to prevent ovulation and pregnancy. Some products are also used to treat acne in females. This medicine may be used for other purposes; ask your health care provider or pharmacist if you have questions. COMMON BRAND NAME(S): Blisovi 24 Fe, Blisovi Fe, Estrostep Fe, Gildess 24 Fe, Gildess Fe 1.5/30, Gildess Fe 1/20, Junel Fe 1.5/30, Junel Fe 1/20, Junel Fe 24, Larin Fe, Lo Loestrin Fe, Loestrin 24 Fe, Loestrin FE 1.5/30, Loestrin FE 1/20, Lomedia 24 Fe, Microgestin 24 Fe, Microgestin Fe 1.5/30, Microgestin Fe 1/20, Tarina Fe 1/20, Taytulla, Tilia Fe, Tri-Legest Fe What should I tell my health care provider before I take this medicine? They need to know if you have any of these conditions: -abnormal vaginal bleeding -blood vessel disease -breast, cervical, endometrial, ovarian, liver, or uterine cancer -diabetes -gallbladder disease -heart disease or recent heart attack -high blood pressure -high cholesterol -history of blood clots -kidney disease -liver disease -migraine headaches -smoke tobacco -stroke -systemic lupus erythematosus (SLE) -an unusual or allergic reaction to estrogens, progestins, other medicines, foods, dyes, or preservatives -pregnant or trying to get pregnant -breast-feeding How should I use this medicine? Take this medicine by mouth. To reduce nausea, this medicine may be taken with food. Follow the directions on the prescription label. Take this medicine at the same time each day and in the order directed on the package. Do not take your medicine more often than directed. A patient package insert for the product will be  given with each prescription and refill. Read this sheet carefully each time. The sheet may change frequently. Contact your pediatrician regarding the use of this medicine in children. Special care may be needed. This medicine has been used in female children who have started having menstrual periods. Overdosage: If you think you have taken too much of this medicine contact a poison control center or emergency room at once. NOTE: This medicine is only for you. Do not share this medicine with others. What if I miss a dose? If you miss a dose, refer to the patient information sheet you received with your medicine for direction. If you miss more than one pill, this medicine may not be as effective and you may need to use another form of birth control. What may interact with this medicine? Do not take this medicine with the following medication: -dasabuvir; ombitasvir; paritaprevir; ritonavir -ombitasvir; paritaprevir; ritonavir This medicine may also interact with the following medications: -acetaminophen -antibiotics or medicines for infections, especially rifampin, rifabutin, rifapentine, and griseofulvin, and possibly penicillins or tetracyclines -aprepitant -ascorbic acid (vitamin C) -atorvastatin -barbiturate medicines, such as phenobarbital -bosentan -carbamazepine -caffeine -clofibrate -cyclosporine -dantrolene -doxercalciferol -felbamate -grapefruit juice -hydrocortisone -medicines for anxiety or sleeping problems, such as diazepam or temazepam -medicines for diabetes, including pioglitazone -mineral oil -modafinil -mycophenolate -nefazodone -oxcarbazepine -phenytoin -prednisolone -ritonavir or other medicines for HIV infection or AIDS -rosuvastatin -selegiline -soy isoflavones supplements -St. John's wort -tamoxifen or raloxifene -theophylline -thyroid hormones -topiramate -warfarin This list may not describe all possible interactions. Give your health care  provider a list of all the medicines, herbs, non-prescription drugs, or dietary supplements you use. Also tell them if you smoke, drink alcohol, or use illegal drugs. Some   items may interact with your medicine. What should I watch for while using this medicine? Visit your doctor or health care professional for regular checks on your progress. You will need a regular breast and pelvic exam and Pap smear while on this medicine. Use an additional method of contraception during the first cycle that you take these tablets. If you have any reason to think you are pregnant, stop taking this medicine right away and contact your doctor or health care professional. If you are taking this medicine for hormone related problems, it may take several cycles of use to see improvement in your condition. Smoking increases the risk of getting a blood clot or having a stroke while you are taking birth control pills, especially if you are more than 26 years old. You are strongly advised not to smoke. This medicine can make your body retain fluid, making your fingers, hands, or ankles swell. Your blood pressure can go up. Contact your doctor or health care professional if you feel you are retaining fluid. This medicine can make you more sensitive to the sun. Keep out of the sun. If you cannot avoid being in the sun, wear protective clothing and use sunscreen. Do not use sun lamps or tanning beds/booths. If you wear contact lenses and notice visual changes, or if the lenses begin to feel uncomfortable, consult your eye care specialist. In some women, tenderness, swelling, or minor bleeding of the gums may occur. Notify your dentist if this happens. Brushing and flossing your teeth regularly may help limit this. See your dentist regularly and inform your dentist of the medicines you are taking. If you are going to have elective surgery, you may need to stop taking this medicine before the surgery. Consult your health care  professional for advice. This medicine does not protect you against HIV infection (AIDS) or any other sexually transmitted diseases. What side effects may I notice from receiving this medicine? Side effects that you should report to your doctor or health care professional as soon as possible: -allergic reactions like skin rash, itching or hives, swelling of the face, lips, or tongue -breast tissue changes or discharge -changes in vaginal bleeding during your period or between your periods -changes in vision -chest pain -confusion -coughing up blood -dizziness -feeling faint or lightheaded -headaches or migraines -leg, arm or groin pain -loss of balance or coordination -severe or sudden headaches -stomach pain (severe) -sudden shortness of breath -sudden numbness or weakness of the face, arm or leg -symptoms of vaginal infection like itching, irritation or unusual discharge -tenderness in the upper abdomen -trouble speaking or understanding -vomiting -yellowing of the eyes or skin Side effects that usually do not require medical attention (report to your doctor or health care professional if they continue or are bothersome): -breakthrough bleeding and spotting that continues beyond the 3 initial cycles of pills -breast tenderness -mood changes, anxiety, depression, frustration, anger, or emotional outbursts -increased sensitivity to sun or ultraviolet light -nausea -skin rash, acne, or brown spots on the skin -weight gain (slight) This list may not describe all possible side effects. Call your doctor for medical advice about side effects. You may report side effects to FDA at 1-800-FDA-1088. Where should I keep my medicine? Keep out of the reach of children. Store at room temperature between 15 and 30 degrees C (59 and 86 degrees F). Throw away any unused medicine after the expiration date. NOTE: This sheet is a summary. It may not cover all possible information. If you   have  questions about this medicine, talk to your doctor, pharmacist, or health care provider.  2018 Elsevier/Gold Standard (2016-08-15 08:04:41) Postpartum Depression and Baby Blues The postpartum period begins right after the birth of a baby. During this time, there is often a great amount of joy and excitement. It is also a time of many changes in the life of the parents. Regardless of how many times a mother gives birth, each child brings new challenges and dynamics to the family. It is not unusual to have feelings of excitement along with confusing shifts in moods, emotions, and thoughts. All mothers are at risk of developing postpartum depression or the "baby blues." These mood changes can occur right after giving birth, or they may occur many months after giving birth. The baby blues or postpartum depression can be mild or severe. Additionally, postpartum depression can go away rather quickly, or it can be a long-term condition. What are the causes? Raised hormone levels and the rapid drop in those levels are thought to be a main cause of postpartum depression and the baby blues. A number of hormones change during and after pregnancy. Estrogen and progesterone usually decrease right after the delivery of your baby. The levels of thyroid hormone and various cortisol steroids also rapidly drop. Other factors that play a role in these mood changes include major life events and genetics. What increases the risk? If you have any of the following risks for the baby blues or postpartum depression, know what symptoms to watch out for during the postpartum period. Risk factors that may increase the likelihood of getting the baby blues or postpartum depression include:  Having a personal or family history of depression.  Having depression while being pregnant.  Having premenstrual mood issues or mood issues related to oral contraceptives.  Having a lot of life stress.  Having marital conflict.  Lacking a  social support network.  Having a baby with special needs.  Having health problems, such as diabetes.  What are the signs or symptoms? Symptoms of baby blues include:  Brief changes in mood, such as going from extreme happiness to sadness.  Decreased concentration.  Difficulty sleeping.  Crying spells, tearfulness.  Irritability.  Anxiety.  Symptoms of postpartum depression typically begin within the first month after giving birth. These symptoms include:  Difficulty sleeping or excessive sleepiness.  Marked weight loss.  Agitation.  Feelings of worthlessness.  Lack of interest in activity or food.  Postpartum psychosis is a very serious condition and can be dangerous. Fortunately, it is rare. Displaying any of the following symptoms is cause for immediate medical attention. Symptoms of postpartum psychosis include:  Hallucinations and delusions.  Bizarre or disorganized behavior.  Confusion or disorientation.  How is this diagnosed? A diagnosis is made by an evaluation of your symptoms. There are no medical or lab tests that lead to a diagnosis, but there are various questionnaires that a health care provider may use to identify those with the baby blues, postpartum depression, or psychosis. Often, a screening tool called the Edinburgh Postnatal Depression Scale is used to diagnose depression in the postpartum period. How is this treated? The baby blues usually goes away on its own in 1-2 weeks. Social support is often all that is needed. You will be encouraged to get adequate sleep and rest. Occasionally, you may be given medicines to help you sleep. Postpartum depression requires treatment because it can last several months or longer if it is not treated. Treatment may include individual   or group therapy, medicine, or both to address any social, physiological, and psychological factors that may play a role in the depression. Regular exercise, a healthy diet, rest, and  social support may also be strongly recommended. Postpartum psychosis is more serious and needs treatment right away. Hospitalization is often needed. Follow these instructions at home:  Get as much rest as you can. Nap when the baby sleeps.  Exercise regularly. Some women find yoga and walking to be beneficial.  Eat a balanced and nourishing diet.  Do little things that you enjoy. Have a cup of tea, take a bubble bath, read your favorite magazine, or listen to your favorite music.  Avoid alcohol.  Ask for help with household chores, cooking, grocery shopping, or running errands as needed. Do not try to do everything.  Talk to people close to you about how you are feeling. Get support from your partner, family members, friends, or other new moms.  Try to stay positive in how you think. Think about the things you are grateful for.  Do not spend a lot of time alone.  Only take over-the-counter or prescription medicine as directed by your health care provider.  Keep all your postpartum appointments.  Let your health care provider know if you have any concerns. Contact a health care provider if: You are having a reaction to or problems with your medicine. Get help right away if:  You have suicidal feelings.  You think you may harm the baby or someone else. This information is not intended to replace advice given to you by your health care provider. Make sure you discuss any questions you have with your health care provider. Document Released: 09/08/2004 Document Revised: 05/12/2016 Document Reviewed: 09/16/2013 Elsevier Interactive Patient Education  2017 [employer]

## 2018-10-08 ENCOUNTER — Encounter: Payer: Self-pay | Admitting: Certified Nurse Midwife

## 2018-10-08 ENCOUNTER — Ambulatory Visit (INDEPENDENT_AMBULATORY_CARE_PROVIDER_SITE_OTHER): Payer: BLUE CROSS/BLUE SHIELD | Admitting: Certified Nurse Midwife

## 2018-10-08 VITALS — BP 108/59 | HR 80 | Ht 68.0 in | Wt 133.4 lb

## 2018-10-08 DIAGNOSIS — F329 Major depressive disorder, single episode, unspecified: Secondary | ICD-10-CM

## 2018-10-08 DIAGNOSIS — F32A Depression, unspecified: Secondary | ICD-10-CM

## 2018-10-08 NOTE — Progress Notes (Signed)
GYN ENCOUNTER NOTE  Subjective:       Wanda Sanchez is a 27 y.o. G60P1001 female here for follow up mood check.   Taking Lo Loestrin. Speaking with counselor every other week. "Feeling good".  Daughter started childcare network this morning (10/08/2018).   No SI/HI. Denies difficulty breathing or respiratory distress, chest pain, abdominal pain, excessive vaginal bleeding, dysuria, and leg pain or swelling.    Gynecologic History  Patient's last menstrual period was 09/21/2018 (exact date).  Contraception: OCP (estrogen/progesterone)  Last Pap: 05/2017. Results were: normal  Obstetric History  OB History  Gravida Para Term Preterm AB Living  1 1 1     1   SAB TAB Ectopic Multiple Live Births          1    # Outcome Date GA Lbr Len/2nd Weight Sex Delivery Anes PTL Lv  1 Term 2017    F Vag-Spont  N LIV    No past medical history on file.  No past surgical history on file.  Current Outpatient Medications on File Prior to Visit  Medication Sig Dispense Refill  . Norethindrone-Ethinyl Estradiol-Fe Biphas (LO LOESTRIN FE) 1 MG-10 MCG / 10 MCG tablet Take 1 tablet by mouth daily. 3 Package 4   No current facility-administered medications on file prior to visit.     No Known Allergies  Social History   Socioeconomic History  . Marital status: Married    Spouse name: Not on file  . Number of children: Not on file  . Years of education: Not on file  . Highest education level: Not on file  Occupational History  . Not on file  Social Needs  . Financial resource strain: Not on file  . Food insecurity:    Worry: Not on file    Inability: Not on file  . Transportation needs:    Medical: Not on file    Non-medical: Not on file  Tobacco Use  . Smoking status: Never Smoker  . Smokeless tobacco: Never Used  Substance and Sexual Activity  . Alcohol use: Yes    Comment: occasional   . Drug use: Never  . Sexual activity: Yes    Birth control/protection: Pill   Lifestyle  . Physical activity:    Days per week: Not on file    Minutes per session: Not on file  . Stress: Not on file  Relationships  . Social connections:    Talks on phone: Not on file    Gets together: Not on file    Attends religious service: Not on file    Active member of club or organization: Not on file    Attends meetings of clubs or organizations: Not on file    Relationship status: Not on file  . Intimate partner violence:    Fear of current or ex partner: Not on file    Emotionally abused: Not on file    Physically abused: Not on file    Forced sexual activity: Not on file  Other Topics Concern  . Not on file  Social History Narrative  . Not on file    Family History  Problem Relation Age of Onset  . Breast cancer Maternal Aunt   . Breast cancer Paternal Aunt   . Cancer Maternal Grandfather   . Healthy Mother   . Healthy Father   . Ovarian cancer Neg Hx   . Colon cancer Neg Hx     The following portions of the patient's history were reviewed  and updated as appropriate: allergies, current medications, past family history, past medical history, past social history, past surgical history and problem list.  Review of Systems  ROS negative except as noted above. Information obtained from patient.   Objective:   BP (!) 108/59   Pulse 80   Ht 5\' 8"  (1.727 m)   Wt 133 lb 6.4 oz (60.5 kg)   LMP 09/21/2018 (Exact Date)   BMI 20.28 kg/m    CONSTITUTIONAL: Well-developed, well-nourished female in no acute distress.   Depression screen [facility] 2/9 10/08/2018 09/03/2018 07/31/2018  Decreased Interest 1 0 1  Down, Depressed, Hopeless 0 0 1  PHQ - 2 Score 1 0 2  Altered sleeping 0 0 0  Tired, decreased energy 0 1 1  Change in appetite 0 0 0  Feeling bad or failure about yourself  0 0 1  Trouble concentrating 0 0 1  Moving slowly or fidgety/restless 0 0 0  Suicidal thoughts 0 0 0  PHQ-9 Score 1 1 5   Difficult doing work/chores Not difficult at all Not  difficult at all -     Assessment:   1. Depression, unspecified depression type   Plan:   Plans to attend counseling monthly.   Continue Lo Loestrin as ordered.   Reviewed red flag symptoms and when to call.   RTC x 9 Months for ANNUAL EXAM or sooner if needed.    Gunnar Bulla, CNM [facility], [facility] ([place])

## 2018-10-08 NOTE — Patient Instructions (Signed)
Ethinyl Estradiol; Norethindrone Acetate; Ferrous fumarate tablets or capsules What is this medicine? ETHINYL ESTRADIOL; NORETHINDRONE ACETATE; FERROUS FUMARATE (ETH in il es tra DYE ole; nor eth IN drone AS e tate; FER us FUE ma rate) is an oral contraceptive. The products combine two types of female hormones, an estrogen and a progestin. They are used to prevent ovulation and pregnancy. Some products are also used to treat acne in females. This medicine may be used for other purposes; ask your health care provider or pharmacist if you have questions. COMMON BRAND NAME(S): Blisovi 24 Fe, Blisovi Fe, Estrostep Fe, Gildess 24 Fe, Gildess Fe 1.5/30, Gildess Fe 1/20, Junel Fe 1.5/30, Junel Fe 1/20, Junel Fe 24, Larin Fe, Lo Loestrin Fe, Loestrin 24 Fe, Loestrin FE 1.5/30, Loestrin FE 1/20, Lomedia 24 Fe, Microgestin 24 Fe, Microgestin Fe 1.5/30, Microgestin Fe 1/20, Tarina Fe 1/20, Taytulla, Tilia Fe, Tri-Legest Fe What should I tell my health care provider before I take this medicine? They need to know if you have any of these conditions: -abnormal vaginal bleeding -blood vessel disease -breast, cervical, endometrial, ovarian, liver, or uterine cancer -diabetes -gallbladder disease -heart disease or recent heart attack -high blood pressure -high cholesterol -history of blood clots -kidney disease -liver disease -migraine headaches -smoke tobacco -stroke -systemic lupus erythematosus (SLE) -an unusual or allergic reaction to estrogens, progestins, other medicines, foods, dyes, or preservatives -pregnant or trying to get pregnant -breast-feeding How should I use this medicine? Take this medicine by mouth. To reduce nausea, this medicine may be taken with food. Follow the directions on the prescription label. Take this medicine at the same time each day and in the order directed on the package. Do not take your medicine more often than directed. A patient package insert for the product will be  given with each prescription and refill. Read this sheet carefully each time. The sheet may change frequently. Contact your pediatrician regarding the use of this medicine in children. Special care may be needed. This medicine has been used in female children who have started having menstrual periods. Overdosage: If you think you have taken too much of this medicine contact a poison control center or emergency room at once. NOTE: This medicine is only for you. Do not share this medicine with others. What if I miss a dose? If you miss a dose, refer to the patient information sheet you received with your medicine for direction. If you miss more than one pill, this medicine may not be as effective and you may need to use another form of birth control. What may interact with this medicine? Do not take this medicine with the following medication: -dasabuvir; ombitasvir; paritaprevir; ritonavir -ombitasvir; paritaprevir; ritonavir This medicine may also interact with the following medications: -acetaminophen -antibiotics or medicines for infections, especially rifampin, rifabutin, rifapentine, and griseofulvin, and possibly penicillins or tetracyclines -aprepitant -ascorbic acid (vitamin C) -atorvastatin -barbiturate medicines, such as phenobarbital -bosentan -carbamazepine -caffeine -clofibrate -cyclosporine -dantrolene -doxercalciferol -felbamate -grapefruit juice -hydrocortisone -medicines for anxiety or sleeping problems, such as diazepam or temazepam -medicines for diabetes, including pioglitazone -mineral oil -modafinil -mycophenolate -nefazodone -oxcarbazepine -phenytoin -prednisolone -ritonavir or other medicines for HIV infection or AIDS -rosuvastatin -selegiline -soy isoflavones supplements -St. John's wort -tamoxifen or raloxifene -theophylline -thyroid hormones -topiramate -warfarin This list may not describe all possible interactions. Give your health care  provider a list of all the medicines, herbs, non-prescription drugs, or dietary supplements you use. Also tell them if you smoke, drink alcohol, or use illegal drugs. Some   items may interact with your medicine. What should I watch for while using this medicine? Visit your doctor or health care professional for regular checks on your progress. You will need a regular breast and pelvic exam and Pap smear while on this medicine. Use an additional method of contraception during the first cycle that you take these tablets. If you have any reason to think you are pregnant, stop taking this medicine right away and contact your doctor or health care professional. If you are taking this medicine for hormone related problems, it may take several cycles of use to see improvement in your condition. Smoking increases the risk of getting a blood clot or having a stroke while you are taking birth control pills, especially if you are more than 26 years old. You are strongly advised not to smoke. This medicine can make your body retain fluid, making your fingers, hands, or ankles swell. Your blood pressure can go up. Contact your doctor or health care professional if you feel you are retaining fluid. This medicine can make you more sensitive to the sun. Keep out of the sun. If you cannot avoid being in the sun, wear protective clothing and use sunscreen. Do not use sun lamps or tanning beds/booths. If you wear contact lenses and notice visual changes, or if the lenses begin to feel uncomfortable, consult your eye care specialist. In some women, tenderness, swelling, or minor bleeding of the gums may occur. Notify your dentist if this happens. Brushing and flossing your teeth regularly may help limit this. See your dentist regularly and inform your dentist of the medicines you are taking. If you are going to have elective surgery, you may need to stop taking this medicine before the surgery. Consult your health care  professional for advice. This medicine does not protect you against HIV infection (AIDS) or any other sexually transmitted diseases. What side effects may I notice from receiving this medicine? Side effects that you should report to your doctor or health care professional as soon as possible: -allergic reactions like skin rash, itching or hives, swelling of the face, lips, or tongue -breast tissue changes or discharge -changes in vaginal bleeding during your period or between your periods -changes in vision -chest pain -confusion -coughing up blood -dizziness -feeling faint or lightheaded -headaches or migraines -leg, arm or groin pain -loss of balance or coordination -severe or sudden headaches -stomach pain (severe) -sudden shortness of breath -sudden numbness or weakness of the face, arm or leg -symptoms of vaginal infection like itching, irritation or unusual discharge -tenderness in the upper abdomen -trouble speaking or understanding -vomiting -yellowing of the eyes or skin Side effects that usually do not require medical attention (report to your doctor or health care professional if they continue or are bothersome): -breakthrough bleeding and spotting that continues beyond the 3 initial cycles of pills -breast tenderness -mood changes, anxiety, depression, frustration, anger, or emotional outbursts -increased sensitivity to sun or ultraviolet light -nausea -skin rash, acne, or brown spots on the skin -weight gain (slight) This list may not describe all possible side effects. Call your doctor for medical advice about side effects. You may report side effects to FDA at 1-800-FDA-1088. Where should I keep my medicine? Keep out of the reach of children. Store at room temperature between 15 and 30 degrees C (59 and 86 degrees F). Throw away any unused medicine after the expiration date. NOTE: This sheet is a summary. It may not cover all possible information. If you   have  questions about this medicine, talk to your doctor, pharmacist, or health care provider.  2018 [employer]vier/Gold Standard (2016-08-15 08:04:41)  

## 2018-12-19 NOTE — L&D Delivery Note (Signed)
Delivery Note At  1001am a viable and healthy female "Eulas Post"  was delivered via  (Presentation:OA ;  ).  APGAR:8 ,9 ; weight pending skin to skin  .   Placenta status: , .  Cord:  with the following complications: variable and sometimes prolonged decels in active labor and during pushing. Vacuum disc applied at +2 station and pulled with three contraction, had one pop-off on first pull. Infant delivered over superficial first degree, lusty cry immediately and no nuchal cord noted.   Anesthesia:  none Episiotomy:  none Lacerations:  superficial first degree not needing repair Suture Repair: NA Est. Blood Loss (mL):  150  Mom to postpartum.  Baby to Couplet care / Skin to Skin.  Melody N Shambley 10/16/2019, 10:14 AM

## 2019-02-12 ENCOUNTER — Encounter: Payer: Self-pay | Admitting: Certified Nurse Midwife

## 2019-02-12 ENCOUNTER — Telehealth: Payer: Self-pay | Admitting: Certified Nurse Midwife

## 2019-02-12 ENCOUNTER — Ambulatory Visit: Payer: BLUE CROSS/BLUE SHIELD | Admitting: Certified Nurse Midwife

## 2019-02-12 VITALS — BP 102/59 | HR 76 | Ht 68.0 in | Wt 123.2 lb

## 2019-02-12 DIAGNOSIS — N912 Amenorrhea, unspecified: Secondary | ICD-10-CM

## 2019-02-12 LAB — POCT URINE PREGNANCY: Preg Test, Ur: POSITIVE — AB

## 2019-02-12 NOTE — Progress Notes (Signed)
GYN ENCOUNTER NOTE  Subjective:       Wanda Sanchez is a 28 y.o. G69P1001 female here for pregnancy confirmation.   Reports positive home pregnancy test on last Tuesday. Stopped taking pills a few months ago after missing a few doses. Taking prenatal vitamin with folic acid and DHA  Desires cell free DNA testing.   Denies difficulty breathing or respiratory distress, chest pain, abdominal pain, vaginal bleeding, dysuria, and leg pain or swelling.    Gynecologic History  Patient's last menstrual period was 12/30/2018 (approximate).  Contraception: none  Last Pap: 05/2017. Results were: normal  Obstetric History  OB History  Gravida Para Term Preterm AB Living  2 1 1     1   SAB TAB Ectopic Multiple Live Births          1    # Outcome Date GA Lbr Len/2nd Weight Sex Delivery Anes PTL Lv  2 Current           1 Term 2017   7 lb 6.9 oz (3.371 kg) F Vag-Spont  N LIV    No past medical history on file.  No past surgical history on file.  Current Outpatient Medications on File Prior to Visit  Medication Sig Dispense Refill  . Prenatal Vit-Fe Fumarate-FA (PRENATAL MULTIVITAMIN) TABS tablet Take 1 tablet by mouth daily at 12 noon.     No current facility-administered medications on file prior to visit.     No Known Allergies  Social History   Socioeconomic History  . Marital status: Married    Spouse name: Not on file  . Number of children: Not on file  . Years of education: Not on file  . Highest education level: Not on file  Occupational History  . Not on file  Social Needs  . Financial resource strain: Not on file  . Food insecurity:    Worry: Not on file    Inability: Not on file  . Transportation needs:    Medical: Not on file    Non-medical: Not on file  Tobacco Use  . Smoking status: Never Smoker  . Smokeless tobacco: Never Used  Substance and Sexual Activity  . Alcohol use: Not Currently    Comment: occasional   . Drug use: Never  . Sexual activity:  Yes    Birth control/protection: None  Lifestyle  . Physical activity:    Days per week: Not on file    Minutes per session: Not on file  . Stress: Not on file  Relationships  . Social connections:    Talks on phone: Not on file    Gets together: Not on file    Attends religious service: Not on file    Active member of club or organization: Not on file    Attends meetings of clubs or organizations: Not on file    Relationship status: Not on file  . Intimate partner violence:    Fear of current or ex partner: Not on file    Emotionally abused: Not on file    Physically abused: Not on file    Forced sexual activity: Not on file  Other Topics Concern  . Not on file  Social History Narrative  . Not on file    Family History  Problem Relation Age of Onset  . Breast cancer Maternal Aunt   . Breast cancer Paternal Aunt   . Cancer Maternal Grandfather   . Healthy Mother   . Healthy Father   . Ovarian cancer  Neg Hx   . Colon cancer Neg Hx     The following portions of the patient's history were reviewed and updated as appropriate: allergies, current medications, past family history, past medical history, past social history, past surgical history and problem list.  Review of Systems  ROS negative except as noted above. Information obtained from patient.   Objective:   BP (!) 102/59   Pulse 76   Ht 5\' 8"  (1.727 m)   Wt 123 lb 3 oz (55.9 kg)   LMP 12/30/2018 (Approximate)   BMI 18.73 kg/m    CONSTITUTIONAL: Well-developed, well-nourished female in no acute distress.   Recent Results (from the past 2160 hour(s))  POCT urine pregnancy     Status: Abnormal   Collection Time: 02/12/19  1:38 PM  Result Value Ref Range   Preg Test, Ur Positive (A) Negative    Assessment:   1. Amenorrhea  - POCT urine pregnancy   Plan:   First trimester education, see AVS.   Reviewed red flag symptoms and when to call.   RTC x 3-4 weeks for dating/viability ultrasound and nurse  intake or sooner if needed.    Gunnar Bulla, CNM [facility], [facility] 02/12/19 1:52 PM

## 2019-02-12 NOTE — Telephone Encounter (Signed)
The patient called and stated that she forgot to ask Marcelino Duster if she is okay to still participate in hot yoga. The patient is requesting a call back if possible. Please advise.

## 2019-02-12 NOTE — Patient Instructions (Signed)
WHAT OB PATIENTS CAN EXPECT   Confirmation of pregnancy and ultrasound ordered if medically indicated-[redacted] weeks gestation  New OB (NOB) intake with nurse and New OB (NOB) labs- [redacted] weeks gestation  New OB (NOB) physical examination with provider- 11/[redacted] weeks gestation  Flu vaccine-[redacted] weeks gestation  Anatomy scan-[redacted] weeks gestation  Glucose tolerance test, blood work to test for anemia, T-dap vaccine-[redacted] weeks gestation  Vaginal swabs/cultures-STD/Group B strep-[redacted] weeks gestation  Appointments every 4 weeks until 28 weeks  Every 2 weeks from 28 weeks until 36 weeks  Weekly visits from 36 weeks until delivery  Eating Plan for Pregnant Women While you are pregnant, your body requires additional nutrition to help support your growing baby. You also have a higher need for some vitamins and minerals, such as folic acid, calcium, iron, and vitamin D. Eating a healthy, well-balanced diet is very important for your health and your baby's health. Your need for extra calories varies for the three 22-monthsegments of your pregnancy (trimesters). For most women, it is recommended to consume:  150 extra calories a day during the first trimester.  300 extra calories a day during the second trimester.  300 extra calories a day during the third trimester. What are tips for following this plan?   Do not try to lose weight or go on a diet during pregnancy.  Limit your overall intake of foods that have "empty calories." These are foods that have little nutritional value, such as sweets, desserts, candies, and sugar-sweetened beverages.  Eat a variety of foods (especially fruits and vegetables) to get a full range of vitamins and minerals.  Take a prenatal vitamin to help meet your additional vitamin and mineral needs during pregnancy, specifically for folic acid, iron, calcium, and vitamin D.  Remember to stay active. Ask your health care provider what types of exercise and activities are safe for  you.  Practice good food safety and cleanliness. Wash your hands before you eat and after you prepare raw meat. Wash all fruits and vegetables well before peeling or eating. Taking these actions can help to prevent food-borne illnesses that can be very dangerous to your baby, such as listeriosis. Ask your health care provider for more information about listeriosis. What does 150 extra calories look like? Healthy options that provide 150 extra calories each day could be any of the following:  6-8 oz (170-230 g) of plain low-fat yogurt with  cup of berries.  1 apple with 2 teaspoons (11 g) of peanut butter.  Cut-up vegetables with  cup (60 g) of hummus.  8 oz (230 mL) or 1 cup of low-fat chocolate milk.  1 stick of string cheese with 1 medium orange.  1 peanut butter and jelly sandwich that is made with one slice of whole-wheat bread and 1 tsp (5 g) of peanut butter. For 300 extra calories, you could eat two of those healthy options each day. What is a healthy amount of weight to gain? The right amount of weight gain for you is based on your BMI before you became pregnant. If your BMI:  Was less than 18 (underweight), you should gain 28-40 lb (13-18 kg).  Was 18-24.9 (normal), you should gain 25-35 lb (11-16 kg).  Was 25-29.9 (overweight), you should gain 15-25 lb (7-11 kg).  Was 30 or greater (obese), you should gain 11-20 lb (5-9 kg). What if I am having twins or multiples? Generally, if you are carrying twins or multiples:  You may need to eat 300-600  extra calories a day.  The recommended range for total weight gain is 25-54 lb (11-25 kg), depending on your BMI before pregnancy.  Talk with your health care provider to find out about nutritional needs, weight gain, and exercise that is right for you. What foods can I eat?  Grains All grains. Choose whole grains, such as whole-wheat bread, oatmeal, or brown rice. Vegetables All vegetables. Eat a variety of colors and types  of vegetables. Remember to wash your vegetables well before peeling or eating. Fruits All fruits. Eat a variety of colors and types of fruit. Remember to wash your fruits well before peeling or eating. Meats and other protein foods Lean meats, including chicken, [place], fish, and lean cuts of beef, veal, or pork. If you eat fish or seafood, choose options that are higher in omega-3 fatty acids and lower in mercury, such as salmon, herring, mussels, trout, sardines, pollock, shrimp, crab, and lobster. Tofu. Tempeh. Beans. Eggs. Peanut butter and other nut butters. Make sure that all meats, poultry, and eggs are cooked to food-safe temperatures or "well-done." Two or more servings of fish are recommended each week in order to get the most benefits from omega-3 fatty acids that are found in seafood. Choose fish that are lower in mercury. You can find more information online:  [employer] Dairy Pasteurized milk and milk alternatives (such as almond milk). Pasteurized yogurt and pasteurized cheese. Cottage cheese. Sour cream. Beverages Water. Juices that contain 100% fruit juice or vegetable juice. Caffeine-free teas and decaffeinated coffee. Drinks that contain caffeine are okay to drink, but it is better to avoid caffeine. Keep your total caffeine intake to less than 200 mg each day (which is 12 oz or 355 mL of coffee, tea, or soda) or the limit as told by your health care provider. Fats and oils Fats and oils are okay to include in moderation. Sweets and desserts Sweets and desserts are okay to include in moderation. Seasoning and other foods All pasteurized condiments. The items listed above may not be a complete list of recommended foods and beverages. Contact your dietitian for more options. What foods are not recommended? Vegetables Raw (unpasteurized) vegetable juices. Fruits Unpasteurized fruit juices. Meats and other protein foods Lunch meats, bologna, hot dogs, or other deli meats.  (If you must eat those meats, reheat them until they are steaming hot.) Refrigerated pat, meat spreads from a meat counter, smoked seafood that is found in the refrigerated section of a store. Raw or undercooked meats, poultry, and eggs. Raw fish, such as sushi or sashimi. Fish that have high mercury content, such as tilefish, shark, swordfish, and king mackerel. To learn more about mercury in fish, talk with your health care provider or look for online resources, such as:  [employer] Dairy Raw (unpasteurized) milk and any foods that have raw milk in them. Soft cheeses, such as feta, queso blanco, queso fresco, Brie, Camembert cheeses, blue-veined cheeses, and Panela cheese (unless it is made with pasteurized milk, which must be stated on the label). Beverages Alcohol. Sugar-sweetened beverages, such as sodas, teas, or energy drinks. Seasoning and other foods Homemade fermented foods and drinks, such as pickles, sauerkraut, or kombucha drinks. (Store-bought pasteurized versions of these are okay.) Salads that are made in a store or deli, such as ham salad, chicken salad, egg salad, tuna salad, and seafood salad. The items listed above may not be a complete list of foods and beverages to avoid. Contact your dietitian for more information. Where to find more  information To calculate the number of calories you need based on your height, weight, and activity level, you can use an online calculator such as:  PackageNews.is To calculate how much weight you should gain during pregnancy, you can use an online pregnancy weight gain calculator such as:  http://jones-berg.com/ Summary  While you are pregnant, your body requires additional nutrition to help support your growing baby.  Eat a variety of foods, especially fruits and vegetables to get a full range of vitamins and minerals.  Practice good food safety and cleanliness. Wash your hands before  you eat and after you prepare raw meat. Wash all fruits and vegetables well before peeling or eating. Taking these actions can help to prevent food-borne illnesses, such as listeriosis, that can be very dangerous to your baby.  Do not eat raw meat or fish. Do not eat fish that have high mercury content, such as tilefish, shark, swordfish, and king mackerel. Do not eat unpasteurized (raw) dairy.  Take a prenatal vitamin to help meet your additional vitamin and mineral needs during pregnancy, specifically for folic acid, iron, calcium, and vitamin D. This information is not intended to replace advice given to you by your health care provider. Make sure you discuss any questions you have with your health care provider. Document Released: 09/19/2014 Document Revised: 09/01/2017 Document Reviewed: 09/01/2017 Elsevier Interactive Patient Education  2019 [employer]. First Trimester of Pregnancy  The first trimester of pregnancy is from week 1 until the end of week 13 (months 1 through 3). During this time, your baby will begin to develop inside you. At 6-8 weeks, the eyes and face are formed, and the heartbeat can be seen on ultrasound. At the end of 12 weeks, all the baby's organs are formed. Prenatal care is all the medical care you receive before the birth of your baby. Make sure you get good prenatal care and follow all of your doctor's instructions. Follow these instructions at home: Medicines  Take over-the-counter and prescription medicines only as told by your doctor. Some medicines are safe and some medicines are not safe during pregnancy.  Take a prenatal vitamin that contains at least 600 micrograms (mcg) of folic acid.  If you have trouble pooping (constipation), take medicine that will make your stool soft (stool softener) if your doctor approves. Eating and drinking   Eat regular, healthy meals.  Your doctor will tell you the amount of weight gain that is right for you.  Avoid raw  meat and uncooked cheese.  If you feel sick to your stomach (nauseous) or throw up (vomit): ? Eat 4 or 5 small meals a day instead of 3 large meals. ? Try eating a few soda crackers. ? Drink liquids between meals instead of during meals.  To prevent constipation: ? Eat foods that are high in fiber, like fresh fruits and vegetables, whole grains, and beans. ? Drink enough fluids to keep your pee (urine) clear or pale yellow. Activity  Exercise only as told by your doctor. Stop exercising if you have cramps or pain in your lower belly (abdomen) or low back.  Do not exercise if it is too hot, too humid, or if you are in a place of great height (high altitude).  Try to avoid standing for long periods of time. Move your legs often if you must stand in one place for a long time.  Avoid heavy lifting.  Wear low-heeled shoes. Sit and stand up straight.  You can have sex unless your  doctor tells you not to. Relieving pain and discomfort  Wear a good support bra if your breasts are sore.  Take warm water baths (sitz baths) to soothe pain or discomfort caused by hemorrhoids. Use hemorrhoid cream if your doctor says it is okay.  Rest with your legs raised if you have leg cramps or low back pain.  If you have puffy, bulging veins (varicose veins) in your legs: ? Wear support hose or compression stockings as told by your doctor. ? Raise (elevate) your feet for 15 minutes, 3-4 times a day. ? Limit salt in your food. Prenatal care  Schedule your prenatal visits by the twelfth week of pregnancy.  Write down your questions. Take them to your prenatal visits.  Keep all your prenatal visits as told by your doctor. This is important. Safety  Wear your seat belt at all times when driving.  Make a list of emergency phone numbers. The list should include numbers for family, friends, the hospital, and police and fire departments. General instructions  Ask your doctor for a referral to a local  prenatal class. Begin classes no later than at the start of month 6 of your pregnancy.  Ask for help if you need counseling or if you need help with nutrition. Your doctor can give you advice or tell you where to go for help.  Do not use hot tubs, steam rooms, or saunas.  Do not douche or use tampons or scented sanitary pads.  Do not cross your legs for long periods of time.  Avoid all herbs and alcohol. Avoid drugs that are not approved by your doctor.  Do not use any tobacco products, including cigarettes, chewing tobacco, and electronic cigarettes. If you need help quitting, ask your doctor. You may get counseling or other support to help you quit.  Avoid cat litter boxes and soil used by cats. These carry germs that can cause birth defects in the baby and can cause a loss of your baby (miscarriage) or stillbirth.  Visit your dentist. At home, brush your teeth with a soft toothbrush. Be gentle when you floss. Contact a doctor if:  You are dizzy.  You have mild cramps or pressure in your lower belly.  You have a nagging pain in your belly area.  You continue to feel sick to your stomach, you throw up, or you have watery poop (diarrhea).  You have a bad smelling fluid coming from your vagina.  You have pain when you pee (urinate).  You have increased puffiness (swelling) in your face, hands, legs, or ankles. Get help right away if:  You have a fever.  You are leaking fluid from your vagina.  You have spotting or bleeding from your vagina.  You have very bad belly cramping or pain.  You gain or lose weight rapidly.  You throw up blood. It may look like coffee grounds.  You are around people who have [place] measles, fifth disease, or chickenpox.  You have a very bad headache.  You have shortness of breath.  You have any kind of trauma, such as from a fall or a car accident. Summary  The first trimester of pregnancy is from week 1 until the end of week 13 (months  1 through 3).  To take care of yourself and your unborn baby, you will need to eat healthy meals, take medicines only if your doctor tells you to do so, and do activities that are safe for you and your baby.  Keep  all follow-up visits as told by your doctor. This is important as your doctor will have to ensure that your baby is healthy and growing well. This information is not intended to replace advice given to you by your health care provider. Make sure you discuss any questions you have with your health care provider. Document Released: 05/23/2008 Document Revised: 12/13/2016 Document Reviewed: 12/13/2016 Elsevier Interactive Patient Education  2019 [employer].

## 2019-02-13 ENCOUNTER — Telehealth: Payer: Self-pay

## 2019-02-13 NOTE — Telephone Encounter (Signed)
Spoke with pt regarding question from 02/12/19. Per AT no hot yoga for duration of pregnancy. Pt understood reasons.

## 2019-02-25 ENCOUNTER — Other Ambulatory Visit: Payer: Self-pay

## 2019-02-25 DIAGNOSIS — N912 Amenorrhea, unspecified: Secondary | ICD-10-CM

## 2019-03-05 ENCOUNTER — Other Ambulatory Visit: Payer: BLUE CROSS/BLUE SHIELD

## 2019-03-06 ENCOUNTER — Ambulatory Visit: Payer: BLUE CROSS/BLUE SHIELD

## 2019-03-06 ENCOUNTER — Other Ambulatory Visit: Payer: Self-pay

## 2019-03-06 ENCOUNTER — Ambulatory Visit
Admission: RE | Admit: 2019-03-06 | Discharge: 2019-03-06 | Disposition: A | Discharge status: Home / Self Care | Payer: BLUE CROSS/BLUE SHIELD | Source: Ambulatory Visit | Attending: Certified Nurse Midwife | Admitting: Certified Nurse Midwife

## 2019-03-06 DIAGNOSIS — N912 Amenorrhea, unspecified: Secondary | ICD-10-CM | POA: Diagnosis not present

## 2019-03-06 NOTE — Patient Instructions (Signed)
WHAT OB PATIENTS CAN EXPECT   Confirmation of pregnancy and ultrasound ordered if medically indicated-[redacted] weeks gestation  New OB (NOB) intake with nurse and New OB (NOB) labs- [redacted] weeks gestation  New OB (NOB) physical examination with provider- 11/[redacted] weeks gestation  Flu vaccine-[redacted] weeks gestation  Anatomy scan-[redacted] weeks gestation  Glucose tolerance test, blood work to test for anemia, T-dap vaccine-[redacted] weeks gestation  Vaginal swabs/cultures-STD/Group B strep-[redacted] weeks gestation  Appointments every 4 weeks until 28 weeks  Every 2 weeks from 28 weeks until 36 weeks  Weekly visits from 36 weeks until delivery  Prenatal Care Prenatal care is health care during pregnancy. It helps you and your unborn baby (fetus) stay as healthy as possible. Prenatal care may be provided by a midwife, a family practice health care provider, or a childbirth and pregnancy specialist (obstetrician). How does this affect me? During pregnancy, you will be closely monitored for any new conditions that might develop. To lower your risk of pregnancy complications, you and your health care provider will talk about any underlying conditions you have. How does this affect my baby? Early and consistent prenatal care increases the chance that your baby will be healthy during pregnancy. Prenatal care lowers the risk that your baby will be:  Born early (prematurely).  Smaller than expected at birth (small for gestational age). What can I expect at the first prenatal care visit? Your first prenatal care visit will likely be the longest. You should schedule your first prenatal care visit as soon as you know that you are pregnant. Your first visit is a good time to talk about any questions or concerns you have about pregnancy. At your visit, you and your health care provider will talk about:  Your medical history, including: ? Any past pregnancies. ? Your family's medical history. ? The baby's father's medical history.  ? Any long-term (chronic) health conditions you have and how you manage them. ? Any surgeries or procedures you have had. ? Any current over-the-counter or prescription medicines, herbs, or supplements you are taking.  Other factors that could pose a risk to your baby, including:  Your home setting and your stress levels, including: ? Exposure to abuse or violence. ? Household financial strain. ? Mental health conditions you have.  Your daily health habits, including diet and exercise. Your health care provider will also:  Measure your weight, height, and blood pressure.  Do a physical exam, including a pelvic and breast exam.  Perform blood tests and urine tests to check for: ? Urinary tract infection. ? Sexually transmitted infections (STIs). ? Low iron levels in your blood (anemia). ? Blood type and certain proteins on red blood cells (Rh antibodies). ? Infections and immunity to viruses, such as hepatitis B and rubella. ? HIV (human immunodeficiency virus).  Do an ultrasound to confirm your baby's growth and development and to help predict your estimated due date (EDD). This ultrasound is done with a probe that is inserted into the vagina (transvaginal ultrasound).  Discuss your options for genetic screening.  Give you information about how to keep yourself and your baby healthy, including: ? Nutrition and taking vitamins. ? Physical activity. ? How to manage pregnancy symptoms such as nausea and vomiting (morning sickness). ? Infections and substances that may be harmful to your baby and how to avoid them. ? Food safety. ? Dental care. ? Working. ? Travel. ? Warning signs to watch for and when to call your health care provider. How often will  I have prenatal care visits? After your first prenatal care visit, you will have regular visits throughout your pregnancy. The visit schedule is often as follows:  Up to week 28 of pregnancy: once every 4 weeks.  28-36 weeks:  once every 2 weeks.  After 36 weeks: every week until delivery. Some women may have visits more or less often depending on any underlying health conditions and the health of the baby. Keep all follow-up and prenatal care visits as told by your health care provider. This is important. What happens during routine prenatal care visits? Your health care provider will:  Measure your weight and blood pressure.  Check for fetal heart sounds.  Measure the height of your uterus in your abdomen (fundal height). This may be measured starting around week 20 of pregnancy.  Check the position of your baby inside your uterus.  Ask questions about your diet, sleeping patterns, and whether you can feel the baby move.  Review warning signs to watch for and signs of labor.  Ask about any pregnancy symptoms you are having and how you are dealing with them. Symptoms may include: ? Headaches. ? Nausea and vomiting. ? Vaginal discharge. ? Swelling. ? Fatigue. ? Constipation. ? Any discomfort, including back or pelvic pain. Make a list of questions to ask your health care provider at your routine visits. What tests might I have during prenatal care visits? You may have blood, urine, and imaging tests throughout your pregnancy, such as:  Urine tests to check for glucose, protein, or signs of infection.  Glucose tests to check for a form of diabetes that can develop during pregnancy (gestational diabetes mellitus). This is usually done around week 24 of pregnancy.  An ultrasound to check your baby's growth and development and to check for birth defects. This is usually done around week 20 of pregnancy.  A test to check for group B strep (GBS) infection. This is usually done around week 36 of pregnancy.  Genetic testing. This may include blood or imaging tests, such as an ultrasound. Some genetic tests are done during the first trimester and some are done during the second trimester. What else can I  expect during prenatal care visits? Your health care provider may recommend getting certain vaccines during pregnancy. These may include:  A yearly flu shot (annual influenza vaccine). This is especially important if you will be pregnant during flu season.  Tdap (tetanus, diphtheria, pertussis) vaccine. Getting this vaccine during pregnancy can protect your baby from whooping cough (pertussis) after birth. This vaccine may be recommended between weeks 27 and 36 of pregnancy. Later in your pregnancy, your health care provider may give you information about:  Childbirth and breastfeeding classes.  Choosing a health care provider for your baby.  Umbilical cord banking.  Breastfeeding.  Birth control after your baby is born.  The hospital labor and delivery unit and how to tour it.  Registering at the hospital before you go into labor. Where to find more information  Office on Women's Health: LegalWarrants.gl  American Pregnancy Association: americanpregnancy.org  March of Dimes: marchofdimes.org Summary  Prenatal care helps you and your baby stay as healthy as possible during pregnancy.  Your first prenatal care visit will most likely be the longest.  You will have visits and tests throughout your pregnancy to monitor your health and your baby's health.  Bring a list of questions to your visits to ask your health care provider.  Make sure to keep all follow-up and prenatal  care visits with your health care provider. This information is not intended to replace advice given to you by your health care provider. Make sure you discuss any questions you have with your health care provider. Document Released: 12/08/2003 Document Revised: 12/04/2017 Document Reviewed: 12/04/2017 Elsevier Interactive Patient Education  2019 [employer] Morning Sickness  Morning sickness is when you feel sick to your stomach (nauseous) during pregnancy. You may feel sick to your stomach and throw up  (vomit). You may feel sick in the morning, but you can feel this way at any time of day. Some women feel very sick to their stomach and cannot stop throwing up (hyperemesis gravidarum). Follow these instructions at home: Medicines  Take over-the-counter and prescription medicines only as told by your doctor. Do not take any medicines until you talk with your doctor about them first.  Taking multivitamins before getting pregnant can stop or lessen the harshness of morning sickness. Eating and drinking  Eat dry toast or crackers before getting out of bed.  Eat 5 or 6 small meals a day.  Eat dry and bland foods like rice and baked potatoes.  Do not eat greasy, fatty, or spicy foods.  Have someone cook for you if the smell of food causes you to feel sick or throw up.  If you feel sick to your stomach after taking prenatal vitamins, take them at night or with a snack.  Eat protein when you need a snack. Nuts, yogurt, and cheese are good choices.  Drink fluids throughout the day.  Try ginger ale made with real ginger, ginger tea made from fresh grated ginger, or ginger candies. General instructions  Do not use any products that have nicotine or tobacco in them, such as cigarettes and e-cigarettes. If you need help quitting, ask your doctor.  Use an air purifier to keep the air in your house free of smells.  Get lots of fresh air.  Try to avoid smells that make you feel sick.  Try: ? Wearing a bracelet that is used for seasickness (acupressure wristband). ? Going to a doctor who puts thin needles into certain body points (acupuncture) to improve how you feel. Contact a doctor if:  You need medicine to feel better.  You feel dizzy or light-headed.  You are losing weight. Get help right away if:  You feel very sick to your stomach and cannot stop throwing up.  You pass out (faint).  You have very bad pain in your belly. Summary  Morning sickness is when you feel sick to  your stomach (nauseous) during pregnancy.  You may feel sick in the morning, but you can feel this way at any time of day.  Making some changes to what you eat may help your symptoms go away. This information is not intended to replace advice given to you by your health care provider. Make sure you discuss any questions you have with your health care provider. Document Released: 01/12/2005 Document Revised: 01/05/2017 Document Reviewed: 01/05/2017 Elsevier Interactive Patient Education  2019 [employer]. How a Baby Grows During Pregnancy  Pregnancy begins when a female's sperm enters a female's egg (fertilization). Fertilization usually happens in one of the tubes (fallopian tubes) that connect the ovaries to the womb (uterus). The fertilized egg moves down the fallopian tube to the uterus. Once it reaches the uterus, it implants into the lining of the uterus and begins to grow. For the first 10 weeks, the fertilized egg is called an embryo. After 10   weeks, it is called a fetus. As the fetus continues to grow, it receives oxygen and nutrients through tissue (placenta) that grows to support the developing baby. The placenta is the life support system for the baby. It provides oxygen and nutrition and removes waste. Learning as much as you can about your pregnancy and how your baby is developing can help you enjoy the experience. It can also make you aware of when there might be a problem and when to ask questions. How long does a typical pregnancy last? A pregnancy usually lasts 280 days, or about 40 weeks. Pregnancy is divided into three periods of growth, also called trimesters:  First trimester: 0-12 weeks.  Second trimester: 13-27 weeks.  Third trimester: 28-40 weeks. The day when your baby is ready to be born (full term) is your estimated date of delivery. How does my baby develop month by month? First month  The fertilized egg attaches to the inside of the uterus.  Some cells will  form the placenta. Others will form the fetus.  The arms, legs, brain, spinal cord, lungs, and heart begin to develop.  At the end of the first month, the heart begins to beat. Second month  The bones, inner ear, eyelids, hands, and feet form.  The genitals develop.  By the end of 8 weeks, all major organs are developing. Third month  All of the internal organs are forming.  Teeth develop below the gums.  Bones and muscles begin to grow. The spine can flex.  The skin is transparent.  Fingernails and toenails begin to form.  Arms and legs continue to grow longer, and hands and feet develop.  The fetus is about 3 inches (7.6 cm) long. Fourth month  The placenta is completely formed.  The external sex organs, neck, outer ear, eyebrows, eyelids, and fingernails are formed.  The fetus can hear, swallow, and move its arms and legs.  The kidneys begin to produce urine.  The skin is covered with a white, waxy coating (vernix) and very fine hair (lanugo). Fifth month  The fetus moves around more and can be felt for the first time (quickening).  The fetus starts to sleep and wake up and may begin to suck its finger.  The nails grow to the end of the fingers.  The organ in the digestive system that makes bile (gallbladder) functions and helps to digest nutrients.  If your baby is a girl, eggs are present in her ovaries. If your baby is a boy, testicles start to move down into his scrotum. Sixth month  The lungs are formed.  The eyes open. The brain continues to develop.  Your baby has fingerprints and toe prints. Your baby's hair grows thicker.  At the end of the second trimester, the fetus is about 9 inches (22.9 cm) long. Seventh month  The fetus kicks and stretches.  The eyes are developed enough to sense changes in light.  The hands can make a grasping motion.  The fetus responds to sound. Eighth month  All organs and body systems are fully developed and  functioning.  Bones harden, and taste buds develop. The fetus may hiccup.  Certain areas of the brain are still developing. The skull remains soft. Ninth month  The fetus gains about  lb (0.23 kg) each week.  The lungs are fully developed.  Patterns of sleep develop.  The fetus's head typically moves into a head-down position (vertex) in the uterus to prepare for birth.  The   fetus weighs 6-9 lb (2.72-4.08 kg) and is 19-20 inches (48.26-50.8 cm) long. What can I do to have a healthy pregnancy and help my baby develop? General instructions  Take prenatal vitamins as directed by your health care provider. These include vitamins such as folic acid, iron, calcium, and vitamin D. They are important for healthy development.  Take medicines only as directed by your health care provider. Read labels and ask a pharmacist or your health care provider whether over-the-counter medicines, supplements, and prescription drugs are safe to take during pregnancy.  Keep all follow-up visits as directed by your health care provider. This is important. Follow-up visits include prenatal care and screening tests. How do I know if my baby is developing well? At each prenatal visit, your health care provider will do several different tests to check on your health and keep track of your baby's development. These include:  Fundal height and position. ? Your health care provider will measure your growing belly from your pubic bone to the top of the uterus using a tape measure. ? Your health care provider will also feel your belly to determine your baby's position.  Heartbeat. ? An ultrasound in the first trimester can confirm pregnancy and show a heartbeat, depending on how far along you are. ? Your health care provider will check your baby's heart rate at every prenatal visit.  Second trimester ultrasound. ? This ultrasound checks your baby's development. It also may show your baby's gender. What should I  do if I have concerns about my baby's development? Always talk with your health care provider about any concerns that you may have about your pregnancy and your baby. Summary  A pregnancy usually lasts 280 days, or about 40 weeks. Pregnancy is divided into three periods of growth, also called trimesters.  Your health care provider will monitor your baby's growth and development throughout your pregnancy.  Follow your health care provider's recommendations about taking prenatal vitamins and medicines during your pregnancy.  Talk with your health care provider if you have any concerns about your pregnancy or your developing baby. This information is not intended to replace advice given to you by your health care provider. Make sure you discuss any questions you have with your health care provider. Document Released: 05/23/2008 Document Revised: 10/18/2017 Document Reviewed: 10/18/2017 Elsevier Interactive Patient Education  2019 [employer]. Healthy Weight Gain During Pregnancy, Adult A certain amount of weight gain during pregnancy is normal and healthy. How much weight you should gain depends on your overall health and a measurement called BMI (body mass index). BMI is an estimate of your body fat based on your height and weight. You can use an online calculator to figure out your BMI, or you can ask your health care provider to calculate it for you at your next visit. Your recommended pregnancy weight gain is based on your pre-pregnancy BMI. General guidelines for a healthy total weight gain during pregnancy are listed below. If your BMI at or before the start of your pregnancy is:  Less than 18.5 (underweight), you should gain 28-40 lb (13-18 kg).  18.5-24.9 (normal weight), you should gain 25-35 lb (11-16 kg).  25-29.9 (overweight), you should gain 15-25 lb (7-11 kg).  30 or higher (obese), you should gain 11-20 lb (5-9 kg). These ranges vary depending on your individual health. If you  are carrying more than one baby (multiples), it may be safe to gain more weight than these recommendations. If you gain less weight than   recommended, that may be safe as long as your baby is growing and developing normally. How can unhealthy weight gain affect me and my baby? Gaining too much weight during pregnancy can lead to pregnancy complications, such as:  A temporary form of diabetes that develops during pregnancy (gestational diabetes).  High blood pressure during pregnancy and protein in your urine (preeclampsia).  High blood pressure during pregnancy without protein in your urine (gestational hypertension).  Your baby having a high weight at birth, which may: ? Raise your risk of having a more difficult delivery or a surgical delivery (cesarean delivery, or C-section). ? Raise your child's risk of developing obesity during childhood. Not gaining enough weight can be life-threatening for your baby, and it may raise your baby's chances of:  Being born early (preterm).  Growing more slowly than normal during pregnancy (growth restriction).  Having a low weight at birth. What actions can I take to gain a healthy amount of weight during pregnancy? General instructions  Keep track of your weight gain during pregnancy.  Take over-the-counter and prescription medicines only as told by your health care provider. Take all prenatal supplements as directed.  Keep all health care visits during pregnancy (prenatal visits). These visits are a good time to discuss your weight gain. Your health care provider will weigh you at each visit to make sure you are gaining a healthy amount of weight. Nutrition   Eat a balanced, nutrient-rich diet. Eat plenty of: ? Fruits and vegetables, such as berries and broccoli. ? Whole grains, such as millet, barley, whole-wheat breads and cereals, and oatmeal. ? Low-fat dairy products or non-dairy products such as almond milk or rice milk. ? Protein foods,  such as lean meat, chicken, eggs, and legumes (such as peas, beans, soybeans, and lentils).  Avoid foods that are fried or have a lot of fat, salt (sodium), or sugar.  Drink enough fluid to keep your urine pale yellow.  Choose healthy snack and drink options when you are at work or on the go: ? Drink water. Avoid soda, sports drinks, and juices that have added sugar. ? Avoid drinks with caffeine, such as coffee and energy drinks. ? Eat snacks that are high in protein, such as nuts, protein bars, and low-fat yogurt. ? Carry convenient snacks in your purse that do not need refrigeration, such as a pack of trail mix, an apple, or a granola bar.  If you need help improving your diet, work with a health care provider or a diet and nutrition specialist (dietitian). Activity   Exercise regularly, as told by your health care provider. ? If you were active before becoming pregnant, you may be able to continue your regular fitness activities. ? If you were not active before pregnancy, you may gradually build up to exercising for 30 or more minutes on most days of the week. This may include walking, swimming, or yoga.  Ask your health care provider what activities are safe for you. Talk with your health care provider about whether you may need to be excused from certain school or work activities. Where to find more information Learn more about managing your weight gain during pregnancy from:  American Pregnancy Association: www.americanpregnancy.org  [place] Department of Agriculture pregnancy weight gain calculator: www.choosemyplate.gov Summary  Too much weight gain during pregnancy can lead to complications for you and your baby.  Find out your pre-pregnancy BMI to determine how much weight gain is healthy for you.  Eat nutritious foods and stay active.    Keep all of your prenatal visits as told by your health care provider. This information is not intended to replace advice given to you by  your health care provider. Make sure you discuss any questions you have with your health care provider. Document Released: 08/25/2017 Document Revised: 08/25/2017 Document Reviewed: 08/25/2017 Elsevier Interactive Patient Education  2019 [employer]. First Trimester of Pregnancy  The first trimester of pregnancy is from week 1 until the end of week 13 (months 1 through 3). During this time, your baby will begin to develop inside you. At 6-8 weeks, the eyes and face are formed, and the heartbeat can be seen on ultrasound. At the end of 12 weeks, all the baby's organs are formed. Prenatal care is all the medical care you receive before the birth of your baby. Make sure you get good prenatal care and follow all of your doctor's instructions. Follow these instructions at home: Medicines  Take over-the-counter and prescription medicines only as told by your doctor. Some medicines are safe and some medicines are not safe during pregnancy.  Take a prenatal vitamin that contains at least 600 micrograms (mcg) of folic acid.  If you have trouble pooping (constipation), take medicine that will make your stool soft (stool softener) if your doctor approves. Eating and drinking   Eat regular, healthy meals.  Your doctor will tell you the amount of weight gain that is right for you.  Avoid raw meat and uncooked cheese.  If you feel sick to your stomach (nauseous) or throw up (vomit): ? Eat 4 or 5 small meals a day instead of 3 large meals. ? Try eating a few soda crackers. ? Drink liquids between meals instead of during meals.  To prevent constipation: ? Eat foods that are high in fiber, like fresh fruits and vegetables, whole grains, and beans. ? Drink enough fluids to keep your pee (urine) clear or pale yellow. Activity  Exercise only as told by your doctor. Stop exercising if you have cramps or pain in your lower belly (abdomen) or low back.  Do not exercise if it is too hot, too humid, or  if you are in a place of great height (high altitude).  Try to avoid standing for long periods of time. Move your legs often if you must stand in one place for a long time.  Avoid heavy lifting.  Wear low-heeled shoes. Sit and stand up straight.  You can have sex unless your doctor tells you not to. Relieving pain and discomfort  Wear a good support bra if your breasts are sore.  Take warm water baths (sitz baths) to soothe pain or discomfort caused by hemorrhoids. Use hemorrhoid cream if your doctor says it is okay.  Rest with your legs raised if you have leg cramps or low back pain.  If you have puffy, bulging veins (varicose veins) in your legs: ? Wear support hose or compression stockings as told by your doctor. ? Raise (elevate) your feet for 15 minutes, 3-4 times a day. ? Limit salt in your food. Prenatal care  Schedule your prenatal visits by the twelfth week of pregnancy.  Write down your questions. Take them to your prenatal visits.  Keep all your prenatal visits as told by your doctor. This is important. Safety  Wear your seat belt at all times when driving.  Make a list of emergency phone numbers. The list should include numbers for family, friends, the hospital, and police and fire departments. General instructions  Ask   your doctor for a referral to a local prenatal class. Begin classes no later than at the start of month 6 of your pregnancy.  Ask for help if you need counseling or if you need help with nutrition. Your doctor can give you advice or tell you where to go for help.  Do not use hot tubs, steam rooms, or saunas.  Do not douche or use tampons or scented sanitary pads.  Do not cross your legs for long periods of time.  Avoid all herbs and alcohol. Avoid drugs that are not approved by your doctor.  Do not use any tobacco products, including cigarettes, chewing tobacco, and electronic cigarettes. If you need help quitting, ask your doctor. You may  get counseling or other support to help you quit.  Avoid cat litter boxes and soil used by cats. These carry germs that can cause birth defects in the baby and can cause a loss of your baby (miscarriage) or stillbirth.  Visit your dentist. At home, brush your teeth with a soft toothbrush. Be gentle when you floss. Contact a doctor if:  You are dizzy.  You have mild cramps or pressure in your lower belly.  You have a nagging pain in your belly area.  You continue to feel sick to your stomach, you throw up, or you have watery poop (diarrhea).  You have a bad smelling fluid coming from your vagina.  You have pain when you pee (urinate).  You have increased puffiness (swelling) in your face, hands, legs, or ankles. Get help right away if:  You have a fever.  You are leaking fluid from your vagina.  You have spotting or bleeding from your vagina.  You have very bad belly cramping or pain.  You gain or lose weight rapidly.  You throw up blood. It may look like coffee grounds.  You are around people who have German measles, fifth disease, or chickenpox.  You have a very bad headache.  You have shortness of breath.  You have any kind of trauma, such as from a fall or a car accident. Summary  The first trimester of pregnancy is from week 1 until the end of week 13 (months 1 through 3).  To take care of yourself and your unborn baby, you will need to eat healthy meals, take medicines only if your doctor tells you to do so, and do activities that are safe for you and your baby.  Keep all follow-up visits as told by your doctor. This is important as your doctor will have to ensure that your baby is healthy and growing well. This information is not intended to replace advice given to you by your health care provider. Make sure you discuss any questions you have with your health care provider. Document Released: 05/23/2008 Document Revised: 12/13/2016 Document Reviewed: 12/13/2016  Elsevier Interactive Patient Education  2019 Else[employer]. Commonly Asked Questions During Pregnancy  Cats: A parasite can be excreted in cat feces.  To avoid exposure you need to have another person empty the little box.  If you must empty the litter box you will need to wear gloves.  Wash your hands after handling your cat.  This parasite can also be found in raw or undercooked meat so this should also be avoided.  Colds, Sore Throats, Flu: Please check your medication sheet to see what you can take for symptoms.  If your symptoms are unrelieved by these medications please call the office.  Dental Work: Most any dental   work your dentist recommends is permitted.  X-rays should only be taken during the first trimester if absolutely necessary.  Your abdomen should be shielded with a lead apron during all x-rays.  Please notify your provider prior to receiving any x-rays.  Novocaine is fine; gas is not recommended.  If your dentist requires a note from us prior to dental work please call the office and we will provide one for you.  Exercise: Exercise is an important part of staying healthy during your pregnancy.  You may continue most exercises you were accustomed to prior to pregnancy.  Later in your pregnancy you will most likely notice you have difficulty with activities requiring balance like riding a bicycle.  It is important that you listen to your body and avoid activities that put you at a higher risk of falling.  Adequate rest and staying well hydrated are a must!  If you have questions about the safety of specific activities ask your provider.    Exposure to Children with illness: Try to avoid obvious exposure; report any symptoms to us when noted,  If you have chicken pos, red measles or mumps, you should be immune to these diseases.   Please do not take any vaccines while pregnant unless you have checked with your OB provider.  Fetal Movement: After 28 weeks we recommend you do "kick counts"  twice daily.  Lie or sit down in a calm quiet environment and count your baby movements "kicks".  You should feel your baby at least 10 times per hour.  If you have not felt 10 kicks within the first hour get up, walk around and have something sweet to eat or drink then repeat for an additional hour.  If count remains less than 10 per hour notify your provider.  Fumigating: Follow your pest control agent's advice as to how long to stay out of your home.  Ventilate the area well before re-entering.  Hemorrhoids:   Most over-the-counter preparations can be used during pregnancy.  Check your medication to see what is safe to use.  It is important to use a stool softener or fiber in your diet and to drink lots of liquids.  If hemorrhoids seem to be getting worse please call the office.   Hot Tubs:  Hot tubs Jacuzzis and saunas are not recommended while pregnant.  These increase your internal body temperature and should be avoided.  Intercourse:  Sexual intercourse is safe during pregnancy as long as you are comfortable, unless otherwise advised by your provider.  Spotting may occur after intercourse; report any bright red bleeding that is heavier than spotting.  Labor:  If you know that you are in labor, please go to the hospital.  If you are unsure, please call the office and let us help you decide what to do.  Lifting, straining, etc:  If your job requires heavy lifting or straining please check with your provider for any limitations.  Generally, you should not lift items heavier than that you can lift simply with your hands and arms (no back muscles)  Painting:  Paint fumes do not harm your pregnancy, but may make you ill and should be avoided if possible.  Latex or water based paints have less odor than oils.  Use adequate ventilation while painting.  Permanents & Hair Color:  Chemicals in hair dyes are not recommended as they cause increase hair dryness which can increase hair loss during pregnancy.   " Highlighting" and permanents are allowed.    Dye may be absorbed differently and permanents may not hold as well during pregnancy.  Sunbathing:  Use a sunscreen, as skin burns easily during pregnancy.  Drink plenty of fluids; avoid over heating.  Tanning Beds:  Because their possible side effects are still unknown, tanning beds are not recommended.  Ultrasound Scans:  Routine ultrasounds are performed at approximately 20 weeks.  You will be able to see your baby's general anatomy an if you would like to know the gender this can usually be determined as well.  If it is questionable when you conceived you may also receive an ultrasound early in your pregnancy for dating purposes.  Otherwise ultrasound exams are not routinely performed unless there is a medical necessity.  Although you can request a scan we ask that you pay for it when conducted because insurance does not cover " patient request" scans.  Work: If your pregnancy proceeds without complications you may work until your due date, unless your physician or employer advises otherwise.  Round Ligament Pain/Pelvic Discomfort:  Sharp, shooting pains not associated with bleeding are fairly common, usually occurring in the second trimester of pregnancy.  They tend to be worse when standing up or when you remain standing for long periods of time.  These are the result of pressure of certain pelvic ligaments called "round ligaments".  Rest, Tylenol and heat seem to be the most effective relief.  As the womb and fetus grow, they rise out of the pelvis and the discomfort improves.  Please notify the office if your pain seems different than that described.  It may represent a more serious condition.  Common Medications Safe in Pregnancy  Acne:      Constipation:  Benzoyl Peroxide     Colace  Clindamycin      Dulcolax Suppository  Topica Erythromycin     Fibercon  Salicylic Acid      Metamucil         Miralax AVOID:        Senakot   Accutane     Cough:  Retin-A       Cough Drops  Tetracycline      Phenergan w/ Codeine if Rx  Minocycline      Robitussin (Plain & DM)  Antibiotics:     Crabs/Lice:  Ceclor       RID  Cephalosporins    AVOID:  E-Mycins      Kwell  Keflex  Macrobid/Macrodantin   Diarrhea:  Penicillin      Kao-Pectate  Zithromax      Imodium AD         PUSH FLUIDS AVOID:       Cipro     Fever:  Tetracycline      Tylenol (Regular or Extra  Minocycline       Strength)  Levaquin      Extra Strength-Do not          Exceed 8 tabs/24 hrs Caffeine:        <200mg/day (equiv. To 1 cup of coffee or  approx. 3 12 oz sodas)         Gas: Cold/Hayfever:       Gas-X  Benadryl      Mylicon  Claritin       Phazyme  **Claritin-D        Chlor-Trimeton    Headaches:  Dimetapp      ASA-Free Excedrin  Drixoral-Non-Drowsy     Cold Compress  Mucinex (Guaifenasin)       Tylenol (Regular or Extra  Sudafed/Sudafed-12 Hour     Strength)  **Sudafed PE Pseudoephedrine   Tylenol Cold & Sinus     Vicks Vapor Rub  Zyrtec  **AVOID if Problems With Blood Pressure         Heartburn: Avoid lying down for at least 1 hour after meals  Aciphex      Maalox     Rash:  Milk of Magnesia     Benadryl    Mylanta       1% Hydrocortisone Cream  Pepcid  Pepcid Complete   Sleep Aids:  Prevacid      Ambien   Prilosec       Benadryl  Rolaids       Chamomile Tea  Tums (Limit 4/day)     Unisom  Zantac       Tylenol PM         Warm milk-add vanilla or  Hemorrhoids:       Sugar for taste  Anusol/Anusol H.C.  (RX: Analapram 2.5%)  Sugar Substitutes:  Hydrocortisone OTC     Ok in moderation  Preparation H      Tucks        Vaseline lotion applied to tissue with wiping    Herpes:     Throat:  Acyclovir      Oragel  Famvir  Valtrex     Vaccines:         Flu Shot Leg Cramps:       *Gardasil  Benadryl      Hepatitis A         Hepatitis B Nasal Spray:       Pneumovax  Saline Nasal Spray     Polio Booster         Tetanus Nausea:        Tuberculosis test or PPD  Vitamin B6 25 mg TID   AVOID:    Dramamine      *Gardasil  Emetrol       Live Poliovirus  Ginger Root 250 mg QID    MMR (measles, mumps &  High Complex Carbs @ Bedtime    rebella)  Sea Bands-Accupressure    Varicella (Chickenpox)  Unisom 1/2 tab TID     *No known complications           If received before Pain:         Known pregnancy;   Darvocet       Resume series after  Lortab        Delivery  Percocet    Yeast:   Tramadol      Femstat  Tylenol 3      Gyne-lotrimin  Ultram       Monistat  Vicodin           MISC:         All Sunscreens           Hair Coloring/highlights          Insect Repellant's          (Including DEET)         Mystic Tans  .covid Coronavirus (COVID-19) Are you at risk?  Are you at risk for the Coronavirus (COVID-19)?  To be considered HIGH RISK for Coronavirus (COVID-19), you have to meet the following criteria:  . Traveled to [place], [place], [place], [place] or [place]; or in the [place] to [place], [place], [place], or [place]; and have fever, cough, and shortness  of breath within the last 2 weeks of travel OR . Been in close contact with a person diagnosed with COVID-19 within the last 2 weeks and have fever, cough, and shortness of breath . IF YOU DO NOT MEET THESE CRITERIA, YOU ARE CONSIDERED LOW RISK FOR COVID-19.  What to do if you are HIGH RISK for COVID-19?  Marland Kitchen If you are having a medical emergency, call 911. . Seek medical care right away. Before you go to a doctor's office, urgent care or emergency department, call ahead and tell them about your recent travel, contact with someone diagnosed with COVID-19, and your symptoms. You should receive instructions from your physician's office regarding next steps of care.  . When you arrive at healthcare provider, tell the healthcare staff immediately you have returned from visiting [place], [place], [place], [place] or [place]; or traveled in the [place] to  [place], [place], [place], or [place]; in the last two weeks or you have been in close contact with a person diagnosed with COVID-19 in the last 2 weeks.   . Tell the health care staff about your symptoms: fever, cough and shortness of breath. . After you have been seen by a medical provider, you will be either: o Tested for (COVID-19) and discharged home on quarantine except to seek medical care if symptoms worsen, and asked to  - Stay home and avoid contact with others until you get your results (4-5 days)  - Avoid travel on public transportation if possible (such as bus, train, or airplane) or o Sent to the Emergency Department by EMS for evaluation, COVID-19 testing, and possible admission depending on your condition and test results.  What to do if you are LOW RISK for COVID-19?  Reduce your risk of any infection by using the same precautions used for avoiding the common cold or flu:  Marland Kitchen Wash your hands often with soap and warm water for at least 20 seconds.  If soap and water are not readily available, use an alcohol-based hand sanitizer with at least 60% alcohol.  . If coughing or sneezing, cover your mouth and nose by coughing or sneezing into the elbow areas of your shirt or coat, into a tissue or into your sleeve (not your hands). . Avoid shaking hands with others and consider head nods or verbal greetings only. . Avoid touching your eyes, nose, or mouth with unwashed hands.  . Avoid close contact with people who are sick. . Avoid places or events with large numbers of people in one location, like concerts or sporting events. . Carefully consider travel plans you have or are making. . If you are planning any travel outside or inside the [place], visit the CDC's Travelers' Health webpage for the latest health notices. . If you have some symptoms but not all symptoms, continue to monitor at home and seek medical attention if your symptoms worsen. . If you are having a medical  emergency, call 911.   [place] / e-Visit: [email or web address]         [facility]: [phone]  [facility]: [phone]                   [facility]: [phone]    Subchorionic Hematoma  A subchorionic hematoma is a gathering of blood between the outer wall of the embryo (chorion) and the inner wall of the womb (uterus). This condition can cause vaginal  bleeding. If they cause little or no vaginal bleeding, early small hematomas usually shrink on their own and do not affect your baby or pregnancy. When bleeding starts later in pregnancy, or if the hematoma is larger or occurs in older pregnant women, the condition may be more serious. Larger hematomas may get bigger, which increases the chances of miscarriage. This condition also increases the risk of:  Premature separation of the placenta from the uterus.  Premature (preterm) labor.  Stillbirth. What are the causes? The exact cause of this condition is not known. It occurs when blood is trapped between the placenta and the uterine wall because the placenta has separated from the original site of implantation. What increases the risk? You are more likely to develop this condition if:  You were treated with fertility medicines.  You conceived through in vitro fertilization (IVF). What are the signs or symptoms? Symptoms of this condition include:  Vaginal spotting or bleeding.  Contractions of the uterus. These cause abdominal pain. Sometimes you may have no symptoms and the bleeding may only be seen when ultrasound images are taken (transvaginal ultrasound). How is this diagnosed? This condition is diagnosed based on a physical exam. This includes a pelvic exam. You may also have other tests, including:  Blood tests.  Urine tests.  Ultrasound of the abdomen. How is this  treated? Treatment for this condition can vary. Treatment may include:  Watchful waiting. You will be monitored closely for any changes in bleeding. During this stage: ? The hematoma may be reabsorbed by the body. ? The hematoma may separate the fluid-filled space containing the embryo (gestational sac) from the wall of the womb (endometrium).  Medicines.  Activity restriction. This may be needed until the bleeding stops. Follow these instructions at home:  Stay on bed rest if told to do so by your health care provider.  Do not lift anything that is heavier than 10 lbs. (4.5 kg) or as told by your health care provider.  Do not use any products that contain nicotine or tobacco, such as cigarettes and e-cigarettes. If you need help quitting, ask your health care provider.  Track and write down the number of pads you use each day and how soaked (saturated) they are.  Do not use tampons.  Keep all follow-up visits as told by your health care provider. This is important. Your health care provider may ask you to have follow-up blood tests or ultrasound tests or both. Contact a health care provider if:  You have any vaginal bleeding.  You have a fever. Get help right away if:  You have severe cramps in your stomach, back, abdomen, or pelvis.  You pass large clots or tissue. Save any tissue for your health care provider to look at.  You have more vaginal bleeding, and you faint or become lightheaded or weak. Summary  A subchorionic hematoma is a gathering of blood between the outer wall of the placenta and the uterus.  This condition can cause vaginal bleeding.  Sometimes you may have no symptoms and the bleeding may only be seen when ultrasound images are taken.  Treatment may include watchful waiting, medicines, or activity restriction. This information is not intended to replace advice given to you by your health care provider. Make sure you discuss any questions you have with  your health care provider. Document Released: 03/22/2007 Document Revised: 01/31/2017 Document Reviewed: 01/31/2017 Elsevier Interactive Patient Education  2019 [employer].

## 2019-03-06 NOTE — Progress Notes (Signed)
   Saphire Buff presents for NOB nurse intake visit. Pregnancy confirmation done at [facility], 02/12/19, with J. Lawhorn  G2.  P1001.  LMP 12/30/18.  U/S at [facility] on 3/18 showed siup at 8 6/7 and small [facility]. Explained [facility] advised patient if bleeding like a period to contact office. Light bleeding to monitor. Pt voices understanding. Will follow up with JML and NOB PE.  EDD. 10/10/19.   Ga.- 9 weeks.  Pregnancy education material explained and given.  __0_cats in the home.  NOB labs ordered. BMI less than 30. TSH/HbgA1c not ordered. Sickle cell not order due to race. HIV and drug screen explained and ordered. Genetic screening discussed. Genetic testing- patient wants panorama. Will be discussed and ordered at NOB PE.  PNV encouraged. Pt to follow up with provider in _____3______weeks for NOB physical.  [facility] Financial Policy explained. FMLA form explained and signed. Pt works at [employer] and requested a note for lifting restrictions. Given. Last pap 05/2017- WNL. No h/o of abnormal. NO flu vaccine. Pt stated that she understood all information given.

## 2019-03-07 ENCOUNTER — Ambulatory Visit (INDEPENDENT_AMBULATORY_CARE_PROVIDER_SITE_OTHER): Payer: BLUE CROSS/BLUE SHIELD | Admitting: Certified Nurse Midwife

## 2019-03-07 VITALS — BP 98/65 | HR 69 | Ht 68.0 in | Wt 123.9 lb

## 2019-03-07 DIAGNOSIS — Z3481 Encounter for supervision of other normal pregnancy, first trimester: Secondary | ICD-10-CM

## 2019-03-07 DIAGNOSIS — O418X10 Other specified disorders of amniotic fluid and membranes, first trimester, not applicable or unspecified: Secondary | ICD-10-CM

## 2019-03-07 DIAGNOSIS — Z202 Contact with and (suspected) exposure to infections with a predominantly sexual mode of transmission: Secondary | ICD-10-CM

## 2019-03-07 DIAGNOSIS — O468X1 Other antepartum hemorrhage, first trimester: Secondary | ICD-10-CM

## 2019-03-07 LAB — OB RESULTS CONSOLE VARICELLA ZOSTER ANTIBODY, IGG: Varicella: IMMUNE

## 2019-03-08 LAB — ABO AND RH: Rh Factor: POSITIVE

## 2019-03-08 LAB — URINALYSIS, ROUTINE W REFLEX MICROSCOPIC
Bilirubin, UA: NEGATIVE
Glucose, UA: NEGATIVE
Ketones, UA: NEGATIVE
Nitrite, UA: NEGATIVE
Protein, UA: NEGATIVE
RBC, UA: NEGATIVE
Specific Gravity, UA: 1.008 (ref 1.005–1.030)
Urobilinogen, Ur: 0.2 mg/dL (ref 0.2–1.0)
pH, UA: 7 (ref 5.0–7.5)

## 2019-03-08 LAB — NICOTINE SCREEN, URINE: Cotinine Ql Scrn, Ur: NEGATIVE ng/mL

## 2019-03-08 LAB — DRUG PROFILE, UR, 9 DRUGS (LABCORP)
Amphetamines, Urine: NEGATIVE ng/mL
Barbiturate Quant, Ur: NEGATIVE ng/mL
Benzodiazepine Quant, Ur: NEGATIVE ng/mL
Cannabinoid Quant, Ur: NEGATIVE ng/mL
Cocaine (Metab.): NEGATIVE ng/mL
Methadone Screen, Urine: NEGATIVE ng/mL
Opiate Quant, Ur: NEGATIVE ng/mL
PCP Quant, Ur: NEGATIVE ng/mL
Propoxyphene: NEGATIVE ng/mL

## 2019-03-08 LAB — HIV ANTIBODY (ROUTINE TESTING W REFLEX): HIV SCREEN 4TH GENERATION: NONREACTIVE

## 2019-03-08 LAB — RPR: RPR Ser Ql: NONREACTIVE

## 2019-03-08 LAB — MICROSCOPIC EXAMINATION
Bacteria, UA: NONE SEEN
Casts: NONE SEEN /lpf
Epithelial Cells (non renal): >10 /hpf — AB (ref 0–10)

## 2019-03-08 LAB — HEPATITIS B SURFACE ANTIGEN: Hepatitis B Surface Ag: NEGATIVE

## 2019-03-08 LAB — RUBELLA SCREEN: RUBELLA: 5.68 {index} (ref >0.99)

## 2019-03-08 LAB — VARICELLA ZOSTER ANTIBODY, IGG: Varicella zoster IgG: 381 index (ref >165)

## 2019-03-09 LAB — GC/CHLAMYDIA PROBE AMP
Chlamydia trachomatis, NAA: NEGATIVE
Neisseria gonorrhoeae by PCR: NEGATIVE

## 2019-03-10 LAB — URINE CULTURE, OB REFLEX

## 2019-03-10 LAB — CULTURE, OB URINE

## 2019-03-22 ENCOUNTER — Telehealth: Payer: Self-pay

## 2019-03-22 NOTE — Telephone Encounter (Signed)
Coronavirus (COVID-19) Are you at risk?  Are you at risk for the Coronavirus (COVID-19)?  To be considered HIGH RISK for Coronavirus (COVID-19), you have to meet the following criteria:  . Traveled to [place], [place], [place], [place] or [place]; or in the [place] to [place], [place], [place], or [place]; and have fever, cough, and shortness of breath within the last 2 weeks of travel OR . Been in close contact with a person diagnosed with COVID-19 within the last 2 weeks and have fever, cough, and shortness of breath . IF YOU DO NOT MEET THESE CRITERIA, YOU ARE CONSIDERED LOW RISK FOR COVID-19.  What to do if you are HIGH RISK for COVID-19?  Marland Kitchen If you are having a medical emergency, call 911. . Seek medical care right away. Before you go to a doctor's office, urgent care or emergency department, call ahead and tell them about your recent travel, contact with someone diagnosed with COVID-19, and your symptoms. You should receive instructions from your physician's office regarding next steps of care.  . When you arrive at healthcare provider, tell the healthcare staff immediately you have returned from visiting [place], [place], [place], [place] or [place]; or traveled in the [place] to [place], [place], [place], or [place]; in the last two weeks or you have been in close contact with a person diagnosed with COVID-19 in the last 2 weeks.   . Tell the health care staff about your symptoms: fever, cough and shortness of breath. . After you have been seen by a medical provider, you will be either: o Tested for (COVID-19) and discharged home on quarantine except to seek medical care if symptoms worsen, and asked to  - Stay home and avoid contact with others until you get your results (4-5 days)  - Avoid travel on public transportation if possible (such as bus, train, or airplane) or o Sent to the Emergency Department by EMS for evaluation, COVID-19 testing, and possible  admission depending on your condition and test results.  What to do if you are LOW RISK for COVID-19?  Reduce your risk of any infection by using the same precautions used for avoiding the common cold or flu:  Marland Kitchen Wash your hands often with soap and warm water for at least 20 seconds.  If soap and water are not readily available, use an alcohol-based hand sanitizer with at least 60% alcohol.  . If coughing or sneezing, cover your mouth and nose by coughing or sneezing into the elbow areas of your shirt or coat, into a tissue or into your sleeve (not your hands). . Avoid shaking hands with others and consider head nods or verbal greetings only. . Avoid touching your eyes, nose, or mouth with unwashed hands.  . Avoid close contact with people who are Alexias Margerum. . Avoid places or events with large numbers of people in one location, like concerts or sporting events. . Carefully consider travel plans you have or are making. . If you are planning any travel outside or inside the [place], visit the CDC's Travelers' Health webpage for the latest health notices. . If you have some symptoms but not all symptoms, continue to monitor at home and seek medical attention if your symptoms worsen. . If you are having a medical emergency, call 911.  03/22/19 SCREENING NEG SLS ADDITIONAL HEALTHCARE OPTIONS FOR PATIENTS  [place] [facility] / e-Visit: [email or web address]         [facility]: [phone]  [facility]: [phone]                   [facility]: [phone]  

## 2019-03-25 ENCOUNTER — Encounter: Payer: Self-pay | Admitting: Certified Nurse Midwife

## 2019-03-25 ENCOUNTER — Ambulatory Visit (INDEPENDENT_AMBULATORY_CARE_PROVIDER_SITE_OTHER): Payer: BLUE CROSS/BLUE SHIELD | Admitting: Certified Nurse Midwife

## 2019-03-25 ENCOUNTER — Encounter: Payer: BLUE CROSS/BLUE SHIELD | Admitting: Certified Nurse Midwife

## 2019-03-25 ENCOUNTER — Other Ambulatory Visit: Payer: Self-pay

## 2019-03-25 VITALS — BP 99/56 | HR 82 | Wt 128.6 lb

## 2019-03-25 DIAGNOSIS — Z3A11 11 weeks gestation of pregnancy: Secondary | ICD-10-CM

## 2019-03-25 DIAGNOSIS — Z3480 Encounter for supervision of other normal pregnancy, unspecified trimester: Secondary | ICD-10-CM

## 2019-03-25 DIAGNOSIS — Z3481 Encounter for supervision of other normal pregnancy, first trimester: Secondary | ICD-10-CM

## 2019-03-25 LAB — POCT URINALYSIS DIPSTICK OB
Bilirubin, UA: NEGATIVE
Blood, UA: NEGATIVE
Glucose, UA: NEGATIVE
Ketones, UA: NEGATIVE
Leukocytes, UA: NEGATIVE
Nitrite, UA: NEGATIVE
POC,PROTEIN,UA: NEGATIVE
Spec Grav, UA: 1.01 (ref 1.010–1.025)
Urobilinogen, UA: 0.2 E.U./dL
pH, UA: 5 (ref 5.0–8.0)

## 2019-03-25 NOTE — Patient Instructions (Signed)
Round Ligament Pain  The round ligament is a cord of muscle and tissue that helps support the uterus. It can become a source of pain during pregnancy if it becomes stretched or twisted as the baby grows. The pain usually begins in the second trimester (13-28 weeks) of pregnancy, and it can come and go until the baby is delivered. It is not a serious problem, and it does not cause harm to the baby. Round ligament pain is usually a short, sharp, and pinching pain, but it can also be a dull, lingering, and aching pain. The pain is felt in the lower side of the abdomen or in the groin. It usually starts deep in the groin and moves up to the outside of the hip area. The pain may occur when you:  Suddenly change position, such as quickly going from a sitting to standing position.  Roll over in bed.  Cough or sneeze.  Do physical activity. Follow these instructions at home:   Watch your condition for any changes.  When the pain starts, relax. Then try any of these methods to help with the pain: ? Sitting down. ? Flexing your knees up to your abdomen. ? Lying on your side with one pillow under your abdomen and another pillow between your legs. ? Sitting in a warm bath for 15-20 minutes or until the pain goes away.  Take over-the-counter and prescription medicines only as told by your health care provider.  Move slowly when you sit down or stand up.  Avoid long walks if they cause pain.  Stop or reduce your physical activities if they cause pain.  Keep all follow-up visits as told by your health care provider. This is important. Contact a health care provider if:  Your pain does not go away with treatment.  You feel pain in your back that you did not have before.  Your medicine is not helping. Get help right away if:  You have a fever or chills.  You develop uterine contractions.  You have vaginal bleeding.  You have nausea or vomiting.  You have diarrhea.  You have pain  when you urinate. Summary  Round ligament pain is felt in the lower abdomen or groin. It is usually a short, sharp, and pinching pain. It can also be a dull, lingering, and aching pain.  This pain usually begins in the second trimester (13-28 weeks). It occurs because the uterus is stretching with the growing baby, and it is not harmful to the baby.  You may notice the pain when you suddenly change position, when you cough or sneeze, or during physical activity.  Relaxing, flexing your knees to your abdomen, lying on one side, or taking a warm bath may help to get rid of the pain.  Get help from your health care provider if the pain does not go away or if you have vaginal bleeding, nausea, vomiting, diarrhea, or painful urination. This information is not intended to replace advice given to you by your health care provider. Make sure you discuss any questions you have with your health care provider. Document Released: 09/13/2008 Document Revised: 05/23/2018 Document Reviewed: 05/23/2018 Elsevier Interactive Patient Education  2019 [employer]. Common Medications Safe in Pregnancy  Acne:      Constipation:  Benzoyl Peroxide     Colace  Clindamycin      Dulcolax Suppository  Topica Erythromycin     Fibercon  Salicylic Acid      Metamucil  Miralax AVOID:        Senakot   Accutane    Cough:  Retin-A       Cough Drops  Tetracycline      Phenergan w/ Codeine if Rx  Minocycline      Robitussin (Plain & DM)  Antibiotics:     Crabs/Lice:  Ceclor       RID  Cephalosporins    AVOID:  E-Mycins      Kwell  Keflex  Macrobid/Macrodantin   Diarrhea:  Penicillin      Kao-Pectate  Zithromax      Imodium AD         PUSH FLUIDS AVOID:       Cipro     Fever:  Tetracycline      Tylenol (Regular or Extra  Minocycline       Strength)  Levaquin      Extra Strength-Do not          Exceed 8 tabs/24 hrs Caffeine:        <232m/day (equiv. To 1 cup of coffee or  approx. 3 12 oz sodas)          Gas: Cold/Hayfever:       Gas-X  Benadryl      Mylicon  Claritin       Phazyme  **Claritin-D        Chlor-Trimeton    Headaches:  Dimetapp      ASA-Free Excedrin  Drixoral-Non-Drowsy     Cold Compress  Mucinex (Guaifenasin)     Tylenol (Regular or Extra  Sudafed/Sudafed-12 Hour     Strength)  **Sudafed PE Pseudoephedrine   Tylenol Cold & Sinus     Vicks Vapor Rub  Zyrtec  **AVOID if Problems With Blood Pressure         Heartburn: Avoid lying down for at least 1 hour after meals  Aciphex      Maalox     Rash:  Milk of Magnesia     Benadryl    Mylanta       1% Hydrocortisone Cream  Pepcid  Pepcid Complete   Sleep Aids:  Prevacid      Ambien   Prilosec       Benadryl  Rolaids       Chamomile Tea  Tums (Limit 4/day)     Unisom  Zantac       Tylenol PM         Warm milk-add vanilla or  Hemorrhoids:       Sugar for taste  Anusol/Anusol H.C.  (RX: Analapram 2.5%)  Sugar Substitutes:  Hydrocortisone OTC     Ok in moderation  Preparation H      Tucks        Vaseline lotion applied to tissue with wiping    Herpes:     Throat:  Acyclovir      Oragel  Famvir  Valtrex     Vaccines:         Flu Shot Leg Cramps:       *Gardasil  Benadryl      Hepatitis A         Hepatitis B Nasal Spray:       Pneumovax  Saline Nasal Spray     Polio Booster         Tetanus Nausea:       Tuberculosis test or PPD  Vitamin B6 25 mg TID   AVOID:    Dramamine      *  Gardasil  Emetrol       Live Poliovirus  Ginger Root 250 mg QID    MMR (measles, mumps &  High Complex Carbs @ Bedtime    rebella)  Sea Bands-Accupressure    Varicella (Chickenpox)  Unisom 1/2 tab TID     *No known complications           If received before Pain:         Known pregnancy;   Darvocet       Resume series after  Lortab        Delivery  Percocet    Yeast:   Tramadol      Femstat  Tylenol 3      Gyne-lotrimin  Ultram       Monistat  Vicodin           MISC:         All Sunscreens           Hair  Coloring/highlights          Insect Repellant's          (Including DEET)         Mystic Tans

## 2019-03-25 NOTE — Progress Notes (Signed)
NEW OB HISTORY AND PHYSICAL  SUBJECTIVE:       Wanda Sanchez is a 28 y.o. G32P1001 female, Patient's last menstrual period was 12/30/2018 (approximate)., Estimated Date of Delivery: 10/10/19, [redacted]w[redacted]d, presents today for establishment of Prenatal Care. She has no unusual complaints .     Gynecologic History Patient's last menstrual period was 12/30/2018 (approximate). Normal Contraception: none Last Pap: 07/02/18. Results were: normal per pt. Medical records release signed  Obstetric History OB History  Gravida Para Term Preterm AB Living  2 1 1     1   SAB TAB Ectopic Multiple Live Births          1    # Outcome Date GA Lbr Len/2nd Weight Sex Delivery Anes PTL Lv  2 Current           1 Term 2017   7 lb 6.9 oz (3.371 kg) F Vag-Spont  N LIV    No past medical history on file.  No past surgical history on file.  Current Outpatient Medications on File Prior to Visit  Medication Sig Dispense Refill  . Prenatal Vit-Fe Fumarate-FA (PRENATAL MULTIVITAMIN) TABS tablet Take 1 tablet by mouth daily at 12 noon.     No current facility-administered medications on file prior to visit.     No Known Allergies  Social History   Socioeconomic History  . Marital status: Married    Spouse name: Not on file  . Number of children: Not on file  . Years of education: Not on file  . Highest education level: Not on file  Occupational History  . Not on file  Social Needs  . Financial resource strain: Not on file  . Food insecurity:    Worry: Not on file    Inability: Not on file  . Transportation needs:    Medical: Not on file    Non-medical: Not on file  Tobacco Use  . Smoking status: Never Smoker  . Smokeless tobacco: Never Used  Substance and Sexual Activity  . Alcohol use: Not Currently    Comment: occasional   . Drug use: Never  . Sexual activity: Yes    Birth control/protection: None  Lifestyle  . Physical activity:    Days per week: Not on file    Minutes per session: Not  on file  . Stress: Not on file  Relationships  . Social connections:    Talks on phone: Not on file    Gets together: Not on file    Attends religious service: Not on file    Active member of club or organization: Not on file    Attends meetings of clubs or organizations: Not on file    Relationship status: Not on file  . Intimate partner violence:    Fear of current or ex partner: Not on file    Emotionally abused: Not on file    Physically abused: Not on file    Forced sexual activity: Not on file  Other Topics Concern  . Not on file  Social History Narrative  . Not on file    Family History  Problem Relation Age of Onset  . Breast cancer Maternal Aunt   . Breast cancer Paternal Aunt   . Cancer Maternal Grandfather   . Healthy Mother   . Healthy Father   . Ovarian cancer Neg Hx   . Colon cancer Neg Hx     The following portions of the patient's history were reviewed and updated as appropriate: allergies, current  medications, past OB history, past medical history, past surgical history, past family history, past social history, and problem list.    OBJECTIVE: Initial Physical Exam (New OB)  GENERAL APPEARANCE: alert, well appearing, in no apparent distress, oriented to person, place and time HEAD: normocephalic, atraumatic MOUTH: mucous membranes moist, pharynx normal without lesions THYROID: no thyromegaly or masses present BREASTS: no masses noted, no significant tenderness, no palpable axillary nodes, no skin changes LUNGS: clear to auscultation, no wheezes, rales or rhonchi, symmetric air entry HEART: regular rate and rhythm, no murmurs ABDOMEN: soft, nontender, nondistended, no abnormal masses, no epigastric pain EXTREMITIES: no redness or tenderness in the calves or thighs SKIN: normal coloration and turgor, no rashes LYMPH NODES: no adenopathy palpable NEUROLOGIC: alert, oriented, normal speech, no focal findings or movement disorder noted  PELVIC EXAM  EXTERNAL GENITALIA: normal appearing vulva with no masses, tenderness or lesions VAGINA: no abnormal discharge or lesions CERVIX: no lesions or cervical motion tenderness and ectropion present UTERUS: gravid ADNEXA: no masses palpable and nontender OB EXAM PELVIMETRY: appears adequate RECTUM: exam not indicated   ASSESSMENT: Normal pregnancy  PLAN: New OB counseling: The patient has been given an overview regarding routine prenatal care. Recommendations regarding diet, weight gain, and exercise in pregnancy were given. Prenatal testing, optional genetic testing,carrier screening,  and ultrasound use in pregnancy were reviewed. Genetic testing today. Benefits of Breast Feeding were discussed. The patient is encouraged to consider nursing her baby post partum.  Doreene BurkeAnnie Teondre Jarosz, CNM

## 2019-03-26 DIAGNOSIS — Z0289 Encounter for other administrative examinations: Secondary | ICD-10-CM

## 2019-03-26 LAB — CBC
Hematocrit: 41.2 % (ref 34.0–46.6)
Hemoglobin: 13.1 g/dL (ref 11.1–15.9)
MCH: 28.9 pg (ref 26.6–33.0)
MCHC: 31.8 g/dL (ref 31.5–35.7)
MCV: 91 fL (ref 79–97)
Platelets: 279 10*3/uL (ref 150–450)
RBC: 4.54 x10E6/uL (ref 3.77–5.28)
RDW: 12.7 % (ref 11.7–15.4)
WBC: 7.6 10*3/uL (ref 3.4–10.8)

## 2019-03-27 ENCOUNTER — Encounter: Payer: Self-pay | Admitting: Certified Nurse Midwife

## 2019-04-05 ENCOUNTER — Telehealth: Payer: Self-pay

## 2019-04-05 ENCOUNTER — Telehealth: Payer: Self-pay | Admitting: Certified Nurse Midwife

## 2019-04-05 NOTE — Telephone Encounter (Signed)
The patient called and stated that she missed a call to go over her genetic screening results. The patient did not disclose any other information. Please advise.

## 2019-04-05 NOTE — Telephone Encounter (Signed)
Pt called no answer LM to call the office for genetic testing results. Pt's results are negative and she is having a female fetus.

## 2019-05-10 ENCOUNTER — Telehealth: Payer: Self-pay

## 2019-05-10 NOTE — Telephone Encounter (Signed)
Coronavirus (COVID-19) Are you at risk?  Are you at risk for the Coronavirus (COVID-19)?  To be considered HIGH RISK for Coronavirus (COVID-19), you have to meet the following criteria:  . Traveled to [place], [place], [place], [place] or [place]; or in the [place] to [place], [place], [place], or [place]; and have fever, cough, and shortness of breath within the last 2 weeks of travel OR . Been in close contact with a person diagnosed with COVID-19 within the last 2 weeks and have fever, cough, and shortness of breath . IF YOU DO NOT MEET THESE CRITERIA, YOU ARE CONSIDERED LOW RISK FOR COVID-19.  What to do if you are HIGH RISK for COVID-19?  . If you are having a medical emergency, call 911. . Seek medical care right away. Before you go to a doctor's office, urgent care or emergency department, call ahead and tell them about your recent travel, contact with someone diagnosed with COVID-19, and your symptoms. You should receive instructions from your physician's office regarding next steps of care.  . When you arrive at healthcare provider, tell the healthcare staff immediately you have returned from visiting [place], [place], [place], [place] or [place]; or traveled in the [place] to [place], [place], [place], or [place]; in the last two weeks or you have been in close contact with a person diagnosed with COVID-19 in the last 2 weeks.   . Tell the health care staff about your symptoms: fever, cough and shortness of breath. . After you have been seen by a medical provider, you will be either: o Tested for (COVID-19) and discharged home on quarantine except to seek medical care if symptoms worsen, and asked to  - Stay home and avoid contact with others until you get your results (4-5 days)  - Avoid travel on public transportation if possible (such as bus, train, or airplane) or o Sent to the Emergency Department by EMS for evaluation, COVID-19 testing, and possible  admission depending on your condition and test results.  What to do if you are LOW RISK for COVID-19?  Reduce your risk of any infection by using the same precautions used for avoiding the common cold or flu:  . Wash your hands often with soap and warm water for at least 20 seconds.  If soap and water are not readily available, use an alcohol-based hand sanitizer with at least 60% alcohol.  . If coughing or sneezing, cover your mouth and nose by coughing or sneezing into the elbow areas of your shirt or coat, into a tissue or into your sleeve (not your hands). . Avoid shaking hands with others and consider head nods or verbal greetings only. . Avoid touching your eyes, nose, or mouth with unwashed hands.  . Avoid close contact with people who are sick. . Avoid places or events with large numbers of people in one location, like concerts or sporting events. . Carefully consider travel plans you have or are making. . If you are planning any travel outside or inside the [place], visit the CDC's Travelers' Health webpage for the latest health notices. . If you have some symptoms but not all symptoms, continue to monitor at home and seek medical attention if your symptoms worsen. . If you are having a medical emergency, call 911.   ADDITIONAL HEALTHCARE OPTIONS FOR PATIENTS  [facility] / e-Visit: [email or web address]         [facility]: [phone]  [facility]: [phone]                   [facility]: [phone]   Prescreened- neg. cm  

## 2019-05-10 NOTE — Telephone Encounter (Signed)
Coronavirus (COVID-19) Are you at risk?  Are you at risk for the Coronavirus (COVID-19)?  To be considered HIGH RISK for Coronavirus (COVID-19), you have to meet the following criteria:  . Traveled to [place], [place], [place], [place] or [place]; or in the [place] to [place], [place], [place], or [place]; and have fever, cough, and shortness of breath within the last 2 weeks of travel OR . Been in close contact with a person diagnosed with COVID-19 within the last 2 weeks and have fever, cough, and shortness of breath . IF YOU DO NOT MEET THESE CRITERIA, YOU ARE CONSIDERED LOW RISK FOR COVID-19.  What to do if you are HIGH RISK for COVID-19?  . If you are having a medical emergency, call 911. . Seek medical care right away. Before you go to a doctor's office, urgent care or emergency department, call ahead and tell them about your recent travel, contact with someone diagnosed with COVID-19, and your symptoms. You should receive instructions from your physician's office regarding next steps of care.  . When you arrive at healthcare provider, tell the healthcare staff immediately you have returned from visiting [place], [place], [place], [place] or [place]; or traveled in the [place] to [place], [place], [place], or [place]; in the last two weeks or you have been in close contact with a person diagnosed with COVID-19 in the last 2 weeks.   . Tell the health care staff about your symptoms: fever, cough and shortness of breath. . After you have been seen by a medical provider, you will be either: o Tested for (COVID-19) and discharged home on quarantine except to seek medical care if symptoms worsen, and asked to  - Stay home and avoid contact with others until you get your results (4-5 days)  - Avoid travel on public transportation if possible (such as bus, train, or airplane) or o Sent to the Emergency Department by EMS for evaluation, COVID-19 testing, and possible  admission depending on your condition and test results.  What to do if you are LOW RISK for COVID-19?  Reduce your risk of any infection by using the same precautions used for avoiding the common cold or flu:  . Wash your hands often with soap and warm water for at least 20 seconds.  If soap and water are not readily available, use an alcohol-based hand sanitizer with at least 60% alcohol.  . If coughing or sneezing, cover your mouth and nose by coughing or sneezing into the elbow areas of your shirt or coat, into a tissue or into your sleeve (not your hands). . Avoid shaking hands with others and consider head nods or verbal greetings only. . Avoid touching your eyes, nose, or mouth with unwashed hands.  . Avoid close contact with people who are Drayk Humbarger. . Avoid places or events with large numbers of people in one location, like concerts or sporting events. . Carefully consider travel plans you have or are making. . If you are planning any travel outside or inside the [place], visit the CDC's Travelers' Health webpage for the latest health notices. . If you have some symptoms but not all symptoms, continue to monitor at home and seek medical attention if your symptoms worsen. . If you are having a medical emergency, call 911.  05/10/19 SCREENING NEG SLS ADDITIONAL HEALTHCARE OPTIONS FOR PATIENTS  [facility] / e-Visit: [email or web address]         [facility]: [phone]    [facility]: [phone]                   [facility]: [phone]  

## 2019-05-14 ENCOUNTER — Ambulatory Visit (INDEPENDENT_AMBULATORY_CARE_PROVIDER_SITE_OTHER): Payer: BLUE CROSS/BLUE SHIELD

## 2019-05-14 ENCOUNTER — Ambulatory Visit (INDEPENDENT_AMBULATORY_CARE_PROVIDER_SITE_OTHER): Payer: BLUE CROSS/BLUE SHIELD | Admitting: Obstetrics and Gynecology

## 2019-05-14 ENCOUNTER — Other Ambulatory Visit: Payer: Self-pay

## 2019-05-14 ENCOUNTER — Encounter: Payer: Self-pay | Admitting: Obstetrics and Gynecology

## 2019-05-14 VITALS — BP 92/62 | HR 83 | Wt 137.4 lb

## 2019-05-14 DIAGNOSIS — Z3480 Encounter for supervision of other normal pregnancy, unspecified trimester: Secondary | ICD-10-CM

## 2019-05-14 DIAGNOSIS — Z363 Encounter for antenatal screening for malformations: Secondary | ICD-10-CM | POA: Diagnosis not present

## 2019-05-14 LAB — POCT URINALYSIS DIPSTICK OB
Bilirubin, UA: NEGATIVE
Blood, UA: NEGATIVE
Glucose, UA: NEGATIVE
Ketones, UA: NEGATIVE
Leukocytes, UA: NEGATIVE
Nitrite, UA: NEGATIVE
POC,PROTEIN,UA: NEGATIVE
Spec Grav, UA: 1.01 (ref 1.010–1.025)
Urobilinogen, UA: 0.2 E.U./dL
pH, UA: 6 (ref 5.0–8.0)

## 2019-05-14 NOTE — Progress Notes (Signed)
ROB & anatomy scan;SCH resolved and note to return to work at [employer] without other restrictions given. Doing well, reviewed scan below:  Indications:Anatomy Ultrasound Findings:  Singleton intrauterine pregnancy is visualized with FHR at 147 BPM. Biometrics give an (U/S) Gestational age of [redacted]w[redacted]d and an (U/S) EDD of 10/11/2019; this correlates with the clinically established Estimated Date of Delivery: 10/10/19  Fetal presentation is Breech.  EFW: 255 g ( 9oz ). Placenta: anterior. Grade: 1 AFI: subjectively normal.  Anatomic survey is complete and normal; Gender - female.    Right Ovary is normal in appearance. Left Ovary is normal appearance. Survey of the adnexa demonstrates no adnexal masses. There is no free peritoneal fluid in the cul de sac.  Impression: 1. 102w5d Viable Singleton Intrauterine pregnancy by U/S. 2. (U/S) EDD is consistent with Clinically established Estimated Date of Delivery: 10/10/19 . 3. Normal Anatomy Scan

## 2019-05-16 ENCOUNTER — Encounter: Payer: Self-pay | Admitting: Certified Nurse Midwife

## 2019-06-11 ENCOUNTER — Encounter: Payer: BLUE CROSS/BLUE SHIELD | Admitting: Certified Nurse Midwife

## 2019-06-24 ENCOUNTER — Other Ambulatory Visit: Payer: Self-pay

## 2019-06-24 ENCOUNTER — Ambulatory Visit (INDEPENDENT_AMBULATORY_CARE_PROVIDER_SITE_OTHER): Payer: BLUE CROSS/BLUE SHIELD | Admitting: Certified Nurse Midwife

## 2019-06-24 VITALS — BP 100/64 | HR 87 | Wt 146.5 lb

## 2019-06-24 DIAGNOSIS — Z130 Encounter for screening for diseases of the blood and blood-forming organs and certain disorders involving the immune mechanism: Secondary | ICD-10-CM

## 2019-06-24 DIAGNOSIS — Z131 Encounter for screening for diabetes mellitus: Secondary | ICD-10-CM

## 2019-06-24 DIAGNOSIS — O35EXX0 Maternal care for other (suspected) fetal abnormality and damage, fetal genitourinary anomalies, not applicable or unspecified: Secondary | ICD-10-CM

## 2019-06-24 DIAGNOSIS — Z113 Encounter for screening for infections with a predominantly sexual mode of transmission: Secondary | ICD-10-CM

## 2019-06-24 DIAGNOSIS — O358XX0 Maternal care for other (suspected) fetal abnormality and damage, not applicable or unspecified: Secondary | ICD-10-CM

## 2019-06-24 DIAGNOSIS — Z3492 Encounter for supervision of normal pregnancy, unspecified, second trimester: Secondary | ICD-10-CM

## 2019-06-24 LAB — POCT URINALYSIS DIPSTICK OB
Bilirubin, UA: NEGATIVE
Blood, UA: NEGATIVE
Glucose, UA: NEGATIVE
Ketones, UA: NEGATIVE
Nitrite, UA: NEGATIVE
Spec Grav, UA: 1.01 (ref 1.010–1.025)
Urobilinogen, UA: 0.2 E.U./dL
pH, UA: 8 (ref 5.0–8.0)

## 2019-06-24 NOTE — Progress Notes (Signed)
ROB-Doing well, no questions or concerns. Plans NCB, circumcision, and formula feeding. Uses [facility]-Dr Page. Anticipatory guidance regarding course of prenatal care. Reviewed red flag symptoms and when to call. RTC x 4 weeks for 28 wk labs, follow up ultrasound, TDaP/BTC and ROB or sooner if needed.

## 2019-06-24 NOTE — Patient Instructions (Addendum)
WHAT OB PATIENTS CAN EXPECT   Confirmation of pregnancy and ultrasound ordered if medically indicated-[redacted] weeks gestation  New OB (NOB) intake with nurse and New OB (NOB) labs- [redacted] weeks gestation  New OB (NOB) physical examination with provider- 11/[redacted] weeks gestation  Flu vaccine-[redacted] weeks gestation  Anatomy scan-[redacted] weeks gestation  Glucose tolerance test, blood work to test for anemia, T-dap vaccine-[redacted] weeks gestation  Vaginal swabs/cultures-STD/Group B strep-[redacted] weeks gestation  Appointments every 4 weeks until 28 weeks  Every 2 weeks from 28 weeks until 36 weeks  Weekly visits from 36 weeks until delivery  Round Ligament Pain  The round ligament is a cord of muscle and tissue that helps support the uterus. It can become a source of pain during pregnancy if it becomes stretched or twisted as the baby grows. The pain usually begins in the second trimester (13-28 weeks) of pregnancy, and it can come and go until the baby is delivered. It is not a serious problem, and it does not cause harm to the baby. Round ligament pain is usually a short, sharp, and pinching pain, but it can also be a dull, lingering, and aching pain. The pain is felt in the lower side of the abdomen or in the groin. It usually starts deep in the groin and moves up to the outside of the hip area. The pain may occur when you:  Suddenly change position, such as quickly going from a sitting to standing position.  Roll over in bed.  Cough or sneeze.  Do physical activity. Follow these instructions at home:   Watch your condition for any changes.  When the pain starts, relax. Then try any of these methods to help with the pain: ? Sitting down. ? Flexing your knees up to your abdomen. ? Lying on your side with one pillow under your abdomen and another pillow between your legs. ? Sitting in a warm bath for 15-20 minutes or until the pain goes away.  Take over-the-counter and prescription medicines only as told by  your health care provider.  Move slowly when you sit down or stand up.  Avoid long walks if they cause pain.  Stop or reduce your physical activities if they cause pain.  Keep all follow-up visits as told by your health care provider. This is important. Contact a health care provider if:  Your pain does not go away with treatment.  You feel pain in your back that you did not have before.  Your medicine is not helping. Get help right away if:  You have a fever or chills.  You develop uterine contractions.  You have vaginal bleeding.  You have nausea or vomiting.  You have diarrhea.  You have pain when you urinate. Summary  Round ligament pain is felt in the lower abdomen or groin. It is usually a short, sharp, and pinching pain. It can also be a dull, lingering, and aching pain.  This pain usually begins in the second trimester (13-28 weeks). It occurs because the uterus is stretching with the growing baby, and it is not harmful to the baby.  You may notice the pain when you suddenly change position, when you cough or sneeze, or during physical activity.  Relaxing, flexing your knees to your abdomen, lying on one side, or taking a warm bath may help to get rid of the pain.  Get help from your health care provider if the pain does not go away or if you have vaginal bleeding, nausea, vomiting,  diarrhea, or painful urination. This information is not intended to replace advice given to you by your health care provider. Make sure you discuss any questions you have with your health care provider. Document Released: 09/13/2008 Document Revised: 05/23/2018 Document Reviewed: 05/23/2018 Elsevier Patient Education  [place]. Back Pain in Pregnancy Back pain during pregnancy is common. Back pain may be caused by several factors that are related to changes during your pregnancy. Follow these instructions at home: Managing pain, stiffness, and swelling      If directed,  for sudden (acute) back pain, put ice on the painful area. ? Put ice in a plastic bag. ? Place a towel between your skin and the bag. ? Leave the ice on for 20 minutes, 2-3 times per day.  If directed, apply heat to the affected area before you exercise. Use the heat source that your health care provider recommends, such as a moist heat pack or a heating pad. ? Place a towel between your skin and the heat source. ? Leave the heat on for 20-30 minutes. ? Remove the heat if your skin turns bright red. This is especially important if you are unable to feel pain, heat, or cold. You may have a greater risk of getting burned.  If directed, massage the affected area. Activity  Exercise as told by your health care provider. Gentle exercise is the best way to prevent or manage back pain.  Listen to your body when lifting. If lifting hurts, ask for help or bend your knees. This uses your leg muscles instead of your back muscles.  Squat down when picking up something from the floor. Do not bend over.  Only use bed rest for short periods as told by your health care provider. Bed rest should only be used for the most severe episodes of back pain. Standing, sitting, and lying down  Do not stand in one place for long periods of time.  Use good posture when sitting. Make sure your head rests over your shoulders and is not hanging forward. Use a pillow on your lower back if necessary.  Try sleeping on your side, preferably the left side, with a pregnancy support pillow or 1-2 regular pillows between your legs. ? If you have back pain after a night's rest, your bed may be too soft. ? A firm mattress may provide more support for your back during pregnancy. General instructions  Do not wear high heels.  Eat a healthy diet. Try to gain weight within your health care provider's recommendations.  Use a maternity girdle, elastic sling, or back brace as told by your health care provider.  Take  over-the-counter and prescription medicines only as told by your health care provider.  Work with a physical therapist or massage therapist to find ways to manage back pain. Acupuncture or massage therapy may be helpful.  Keep all follow-up visits as told by your health care provider. This is important. Contact a health care provider if:  Your back pain interferes with your daily activities.  You have increasing pain in other parts of your body. Get help right away if:  You develop numbness, tingling, weakness, or problems with the use of your arms or legs.  You develop severe back pain that is not controlled with medicine.  You have a change in bowel or bladder control.  You develop shortness of breath, dizziness, or you faint.  You develop nausea, vomiting, or sweating.  You have back pain that is a rhythmic,  cramping pain similar to labor pains. Labor pain is usually 1-2 minutes apart, lasts for about 1 minute, and involves a bearing down feeling or pressure in your pelvis.  You have back pain and your water breaks or you have vaginal bleeding.  You have back pain or numbness that travels down your leg.  Your back pain developed after you fell.  You develop pain on one side of your back.  You see blood in your urine.  You develop skin blisters in the area of your back pain. Summary  Back pain may be caused by several factors that are related to changes during your pregnancy.  Follow instructions as told by your health care provider for managing pain, stiffness, and swelling.  Exercise as told by your health care provider. Gentle exercise is the best way to prevent or manage back pain.  Take over-the-counter and prescription medicines only as told by your health care provider.  Keep all follow-up visits as told by your health care provider. This is important. This information is not intended to replace advice given to you by your health care provider. Make sure you  discuss any questions you have with your health care provider. Document Released: 03/15/2006 Document Revised: 03/26/2019 Document Reviewed: 05/23/2018 Elsevier Patient Education  [place] of Pregnancy  The third trimester is from week 28 through week 40 (months 7 through 9). This trimester is when your unborn baby (fetus) is growing very fast. At the end of the ninth month, the unborn baby is about 20 inches in length. It weighs about 6-10 pounds. Follow these instructions at home: Medicines  Take over-the-counter and prescription medicines only as told by your doctor. Some medicines are safe and some medicines are not safe during pregnancy.  Take a prenatal vitamin that contains at least 600 micrograms (mcg) of folic acid.  If you have trouble pooping (constipation), take medicine that will make your stool soft (stool softener) if your doctor approves. Eating and drinking   Eat regular, healthy meals.  Avoid raw meat and uncooked cheese.  If you get low calcium from the food you eat, talk to your doctor about taking a daily calcium supplement.  Eat four or five small meals rather than three large meals a day.  Avoid foods that are high in fat and sugars, such as fried and sweet foods.  To prevent constipation: ? Eat foods that are high in fiber, like fresh fruits and vegetables, whole grains, and beans. ? Drink enough fluids to keep your pee (urine) clear or pale yellow. Activity  Exercise only as told by your doctor. Stop exercising if you start to have cramps.  Avoid heavy lifting, wear low heels, and sit up straight.  Do not exercise if it is too hot, too humid, or if you are in a place of great height (high altitude).  You may continue to have sex unless your doctor tells you not to. Relieving pain and discomfort  Wear a good support bra if your breasts are tender.  Take frequent breaks and rest with your legs raised if you have leg cramps  or low back pain.  Take warm water baths (sitz baths) to soothe pain or discomfort caused by hemorrhoids. Use hemorrhoid cream if your doctor approves.  If you develop puffy, bulging veins (varicose veins) in your legs: ? Wear support hose or compression stockings as told by your doctor. ? Raise (elevate) your feet for 15 minutes, 3-4 times a day. ?  Limit salt in your food. Safety  Wear your seat belt when driving.  Make a list of emergency phone numbers, including numbers for family, friends, the hospital, and police and fire departments. Preparing for your baby's arrival To prepare for the arrival of your baby:  Take prenatal classes.  Practice driving to the hospital.  Visit the hospital and tour the maternity area.  Talk to your work about taking leave once the baby comes.  Pack your hospital bag.  Prepare the baby's room.  Go to your doctor visits.  Buy a rear-facing car seat. Learn how to install it in your car. General instructions  Do not use hot tubs, steam rooms, or saunas.  Do not use any products that contain nicotine or tobacco, such as cigarettes and e-cigarettes. If you need help quitting, ask your doctor.  Do not drink alcohol.  Do not douche or use tampons or scented sanitary pads.  Do not cross your legs for long periods of time.  Do not travel for long distances unless you must. Only do so if your doctor says it is okay.  Visit your dentist if you have not gone during your pregnancy. Use a soft toothbrush to brush your teeth. Be gentle when you floss.  Avoid cat litter boxes and soil used by cats. These carry germs that can cause birth defects in the baby and can cause a loss of your baby (miscarriage) or stillbirth.  Keep all your prenatal visits as told by your doctor. This is important. Contact a doctor if:  You are not sure if you are in labor or if your water has broken.  You are dizzy.  You have mild cramps or pressure in your lower  belly.  You have a nagging pain in your belly area.  You continue to feel sick to your stomach, you throw up, or you have watery poop.  You have bad smelling fluid coming from your vagina.  You have pain when you pee. Get help right away if:  You have a fever.  You are leaking fluid from your vagina.  You are spotting or bleeding from your vagina.  You have severe belly cramps or pain.  You lose or gain weight quickly.  You have trouble catching your breath and have chest pain.  You notice sudden or extreme puffiness (swelling) of your face, hands, ankles, feet, or legs.  You have not felt the baby move in over an hour.  You have severe headaches that do not go away with medicine.  You have trouble seeing.  You are leaking, or you are having a gush of fluid, from your vagina before you are 37 weeks.  You have regular belly spasms (contractions) before you are 37 weeks. Summary  The third trimester is from week 28 through week 40 (months 7 through 9). This time is when your unborn baby is growing very fast.  Follow your doctor's advice about medicine, food, and activity.  Get ready for the arrival of your baby by taking prenatal classes, getting all the baby items ready, preparing the baby's room, and visiting your doctor to be checked.  Get help right away if you are bleeding from your vagina, or you have chest pain and trouble catching your breath, or if you have not felt your baby move in over an hour. This information is not intended to replace advice given to you by your health care provider. Make sure you discuss any questions you have with your health  care provider. Document Released: 03/01/2010 Document Revised: 03/28/2019 Document Reviewed: 01/10/2017 Elsevier Patient Education  2020 [employer]

## 2019-06-24 NOTE — Progress Notes (Signed)
ROB-No complaints.  

## 2019-07-09 ENCOUNTER — Encounter: Payer: BLUE CROSS/BLUE SHIELD | Admitting: Certified Nurse Midwife

## 2019-07-15 ENCOUNTER — Encounter: Payer: BLUE CROSS/BLUE SHIELD | Admitting: Certified Nurse Midwife

## 2019-07-24 ENCOUNTER — Ambulatory Visit (INDEPENDENT_AMBULATORY_CARE_PROVIDER_SITE_OTHER): Payer: BC Managed Care – PPO

## 2019-07-24 ENCOUNTER — Ambulatory Visit (INDEPENDENT_AMBULATORY_CARE_PROVIDER_SITE_OTHER): Payer: BC Managed Care – PPO | Admitting: Certified Nurse Midwife

## 2019-07-24 ENCOUNTER — Other Ambulatory Visit: Payer: Self-pay

## 2019-07-24 VITALS — BP 109/59 | HR 90 | Wt 153.5 lb

## 2019-07-24 DIAGNOSIS — Z131 Encounter for screening for diabetes mellitus: Secondary | ICD-10-CM

## 2019-07-24 DIAGNOSIS — O358XX0 Maternal care for other (suspected) fetal abnormality and damage, not applicable or unspecified: Secondary | ICD-10-CM

## 2019-07-24 DIAGNOSIS — Z3481 Encounter for supervision of other normal pregnancy, first trimester: Secondary | ICD-10-CM

## 2019-07-24 DIAGNOSIS — Z130 Encounter for screening for diseases of the blood and blood-forming organs and certain disorders involving the immune mechanism: Secondary | ICD-10-CM

## 2019-07-24 DIAGNOSIS — Z23 Encounter for immunization: Secondary | ICD-10-CM

## 2019-07-24 DIAGNOSIS — O35EXX0 Maternal care for other (suspected) fetal abnormality and damage, fetal genitourinary anomalies, not applicable or unspecified: Secondary | ICD-10-CM

## 2019-07-24 DIAGNOSIS — Z3A28 28 weeks gestation of pregnancy: Secondary | ICD-10-CM | POA: Diagnosis not present

## 2019-07-24 DIAGNOSIS — Z113 Encounter for screening for infections with a predominantly sexual mode of transmission: Secondary | ICD-10-CM

## 2019-07-24 DIAGNOSIS — Z3492 Encounter for supervision of normal pregnancy, unspecified, second trimester: Secondary | ICD-10-CM

## 2019-07-24 LAB — POCT URINALYSIS DIPSTICK OB
Bilirubin, UA: NEGATIVE
Blood, UA: NEGATIVE
Glucose, UA: NEGATIVE
Ketones, UA: NEGATIVE
Leukocytes, UA: NEGATIVE
Nitrite, UA: NEGATIVE
POC,PROTEIN,UA: NEGATIVE
Spec Grav, UA: 1.01 (ref 1.010–1.025)
Urobilinogen, UA: 0.2 E.U./dL
pH, UA: 7.5 (ref 5.0–8.0)

## 2019-07-24 MED ORDER — TETANUS-DIPHTH-ACELL PERTUSSIS 5-2.5-18.5 LF-MCG/0.5 IM SUSP
0.5000 mL | Freq: Once | INTRAMUSCULAR | Status: AC
  Administered 2019-07-24: 0.5 mL via INTRAMUSCULAR

## 2019-07-24 NOTE — Progress Notes (Signed)
ROB doing well. Glucose screen/Tdap/RPR/CC/BTC today. Repeat u/s for bilateral renal pyelectasis showed increase in dilatations ( see below). MFM consult ordered. Reviewed findings with pt. Information given. Reviewed BC after baby and sample birth plan given. Follow up 2 wks.   Philip Aspen, CNM   Patient Name: Bobette Leyh DOB: 08-29-1991 MRN: [id number] ULTRASOUND REPORT  Location: [facility] Date of Service: 07/24/2019   Indications:growth/afi/renal pelvis Findings:  Nelda [place] intrauterine pregnancy is visualized with FHR at 134 BPM. Biometrics give an (U/S) Gestational age of [redacted]w[redacted]d and an (U/S) EDD of 10/05/2019; this correlates with the clinically established Estimated Date of Delivery: 10/10/19.  Fetal presentation is Cephalic.  Placenta: anterior. Grade: 1 AFI: 11.3 cm  Growth percentile is 55. EFW: 1392 g ( 3 lbs 1 oz)   Renal pelvis measures R = 4.46mm    Prior  2.1 mm                                          L = 3.40mm                1.8 mm Impression:          1. [redacted]w[redacted]d Viable Singleton Intrauterine pregnancy previously established criteria. 2. Growth is 55 %ile.  AFI is 11.3 cm.  3. Renal pelvic dilatation has increased slightly from prior ultrasound on 05/14/2019  Recommendations: 1.Clinical correlation with the patient's History and Physical Exam.   Jenine  M. Albertine Grates     RDMS

## 2019-07-24 NOTE — Patient Instructions (Signed)
Td Vaccine (Tetanus and Diphtheria): What You Need to Know 1. Why get vaccinated? Tetanus  and diphtheria are very serious diseases. They are rare in the [place] today, but people who do become infected often have severe complications. Td vaccine is used to protect adolescents and adults from both of these diseases. Both tetanus and diphtheria are infections caused by bacteria. Diphtheria spreads from person to person through coughing or sneezing. Tetanus-causing bacteria enter the body through cuts, scratches, or wounds. TETANUS (Lockjaw) causes painful muscle tightening and stiffness, usually all over the body.  It can lead to tightening of muscles in the head and neck so you can't open your mouth, swallow, or sometimes even breathe. Tetanus kills about 1 out of every 10 people who are infected even after receiving the best medical care. DIPHTHERIA can cause a thick coating to form in the back of the throat.  It can lead to breathing problems, paralysis, heart failure, and death. Before vaccines, as many as 200,000 cases of diphtheria and hundreds of cases of tetanus were reported in the [place] each year. Since vaccination began, reports of cases for both diseases have dropped by about 99%. 2. Td vaccine Td vaccine can protect adolescents and adults from tetanus and diphtheria. Td is usually given as a booster dose every 10 years but it can also be given earlier after a severe and dirty wound or burn. Another vaccine, called Tdap, which protects against pertussis in addition to tetanus and diphtheria, is sometimes recommended instead of Td vaccine. Your doctor or the person giving you the vaccine can give you more information. Td may safely be given at the same time as other vaccines. 3. Some people should not get this vaccine  A person who has ever had a life-threatening allergic reaction after a previous dose of any tetanus or diphtheria containing vaccine, OR has a severe allergy  to any part of this vaccine, should not get Td vaccine. Tell the person giving the vaccine about any severe allergies.  Talk to your doctor if you: ? had severe pain or swelling after any vaccine containing diphtheria or tetanus, ? ever had a condition called Guillain Barr Syndrome (GBS), ? aren't feeling well on the day the shot is scheduled. 4. Risks of a vaccine reaction With any medicine, including vaccines, there is a chance of side effects. These are usually mild and go away on their own. Serious reactions are also possible but are rare. Most people who get Td vaccine do not have any problems with it. Mild Problems following Td vaccine: (Did not interfere with activities)  Pain where the shot was given (about 8 people in 10)  Redness or swelling where the shot was given (about 1 person in 4)  Mild fever (rare)  Headache (about 1 person in 4)  Tiredness (about 1 person in 4) Moderate Problems following Td vaccine: (Interfered with activities, but did not require medical attention)  Fever over 102F (rare) Severe Problems following Td vaccine: (Unable to perform usual activities; required medical attention)  Swelling, severe pain, bleeding and/or redness in the arm where the shot was given (rare). Problems that could happen after any vaccine:  People sometimes faint after a medical procedure, including vaccination. Sitting or lying down for about 15 minutes can help prevent fainting, and injuries caused by a fall. Tell your doctor if you feel dizzy, or have vision changes or ringing in the ears.  Some people get severe pain in the shoulder and have   difficulty moving the arm where a shot was given. This happens very rarely.  Any medication can cause a severe allergic reaction. Such reactions from a vaccine are very rare, estimated at fewer than 1 in a million doses, and would happen within a few minutes to a few hours after the vaccination. As with any medicine, there is a  very remote chance of a vaccine causing a serious injury or death. The safety of vaccines is always being monitored. For more information, visit: www.cdc.gov/vaccinesafety/ 5. What if there is a serious reaction? What should I look for?  Look for anything that concerns you, such as signs of a severe allergic reaction, very high fever, or unusual behavior. Signs of a severe allergic reaction can include hives, swelling of the face and throat, difficulty breathing, a fast heartbeat, dizziness, and weakness. These would usually start a few minutes to a few hours after the vaccination. What should I do?  If you think it is a severe allergic reaction or other emergency that can't wait, call 9-1-1 or get the person to the nearest hospital. Otherwise, call your doctor.  Afterward, the reaction should be reported to the Vaccine Adverse Event Reporting System (VAERS). Your doctor might file this report, or you can do it yourself through the VAERS web site at www.vaers.hhs.gov, or by calling 1-800-822-7967. VAERS does not give medical advice. 6. The National Vaccine Injury Compensation Program The National Vaccine Injury Compensation Program (VICP) is a federal program that was created to compensate people who may have been injured by certain vaccines. Persons who believe they may have been injured by a vaccine can learn about the program and about filing a claim by calling 1-800-338-2382 or visiting the VICP website at www.hrsa.gov/vaccinecompensation. There is a time limit to file a claim for compensation. 7. How can I learn more?  Ask your doctor. He or she can give you the vaccine package insert or suggest other sources of information.  Call your local or state health department.  Contact the Centers for Disease Control and Prevention (CDC): ? Call 1-800-232-4636 (1-800-CDC-INFO) ? Visit CDC's website at www.cdc.gov/vaccines Vaccine Information Statement Td Vaccine (03/29/16) This information is  not intended to replace advice given to you by your health care provider. Make sure you discuss any questions you have with your health care provider. Document Released: 10/02/2006 Document Revised: 07/23/2018 Document Reviewed: 07/23/2018 Elsevier Interactive Patient Education  2020 Elsevier Inc.  

## 2019-07-25 ENCOUNTER — Encounter: Payer: Self-pay | Admitting: Certified Nurse Midwife

## 2019-07-25 DIAGNOSIS — Z862 Personal history of diseases of the blood and blood-forming organs and certain disorders involving the immune mechanism: Secondary | ICD-10-CM | POA: Insufficient documentation

## 2019-07-25 DIAGNOSIS — O99019 Anemia complicating pregnancy, unspecified trimester: Secondary | ICD-10-CM | POA: Insufficient documentation

## 2019-07-25 LAB — ANTIBODY SCREEN: Antibody Screen: NEGATIVE

## 2019-07-25 LAB — CBC
Hematocrit: 31.4 % — ABNORMAL LOW (ref 34.0–46.6)
Hemoglobin: 10.4 g/dL — ABNORMAL LOW (ref 11.1–15.9)
MCH: 28.4 pg (ref 26.6–33.0)
MCHC: 33.1 g/dL (ref 31.5–35.7)
MCV: 86 fL (ref 79–97)
Platelets: 255 10*3/uL (ref 150–450)
RBC: 3.66 x10E6/uL — ABNORMAL LOW (ref 3.77–5.28)
RDW: 11.4 % — ABNORMAL LOW (ref 11.7–15.4)
WBC: 7.8 10*3/uL (ref 3.4–10.8)

## 2019-07-25 LAB — RPR: RPR Ser Ql: NONREACTIVE

## 2019-07-25 LAB — GLUCOSE, 1 HOUR GESTATIONAL: Gestational Diabetes Screen: 88 mg/dL (ref 65–139)

## 2019-07-26 ENCOUNTER — Encounter: Payer: Self-pay | Admitting: Certified Nurse Midwife

## 2019-07-26 ENCOUNTER — Other Ambulatory Visit: Payer: Self-pay

## 2019-07-26 DIAGNOSIS — Z20822 Contact with and (suspected) exposure to covid-19: Secondary | ICD-10-CM

## 2019-07-27 LAB — NOVEL CORONAVIRUS, NAA: SARS-CoV-2, NAA: NOT DETECTED

## 2019-07-30 ENCOUNTER — Other Ambulatory Visit: Payer: Self-pay | Admitting: Certified Nurse Midwife

## 2019-07-30 DIAGNOSIS — Z3689 Encounter for other specified antenatal screening: Secondary | ICD-10-CM

## 2019-08-01 ENCOUNTER — Other Ambulatory Visit: Payer: Self-pay

## 2019-08-01 DIAGNOSIS — O358XX0 Maternal care for other (suspected) fetal abnormality and damage, not applicable or unspecified: Secondary | ICD-10-CM

## 2019-08-01 DIAGNOSIS — O35EXX0 Maternal care for other (suspected) fetal abnormality and damage, fetal genitourinary anomalies, not applicable or unspecified: Secondary | ICD-10-CM

## 2019-08-05 ENCOUNTER — Ambulatory Visit
Admission: RE | Admit: 2019-08-05 | Discharge: 2019-08-05 | Disposition: A | Discharge status: Home / Self Care | Payer: BC Managed Care – PPO | Source: Ambulatory Visit | Attending: Maternal & Fetal Medicine | Admitting: Maternal & Fetal Medicine

## 2019-08-05 ENCOUNTER — Other Ambulatory Visit: Payer: Self-pay

## 2019-08-05 DIAGNOSIS — Z3689 Encounter for other specified antenatal screening: Secondary | ICD-10-CM | POA: Diagnosis not present

## 2019-08-05 DIAGNOSIS — O99012 Anemia complicating pregnancy, second trimester: Secondary | ICD-10-CM

## 2019-08-09 ENCOUNTER — Ambulatory Visit (INDEPENDENT_AMBULATORY_CARE_PROVIDER_SITE_OTHER): Payer: BC Managed Care – PPO | Admitting: Certified Nurse Midwife

## 2019-08-09 ENCOUNTER — Other Ambulatory Visit: Payer: Self-pay

## 2019-08-09 VITALS — BP 101/58 | HR 88 | Wt 157.7 lb

## 2019-08-09 DIAGNOSIS — Z3A30 30 weeks gestation of pregnancy: Secondary | ICD-10-CM

## 2019-08-09 DIAGNOSIS — O358XX0 Maternal care for other (suspected) fetal abnormality and damage, not applicable or unspecified: Secondary | ICD-10-CM

## 2019-08-09 DIAGNOSIS — Z3493 Encounter for supervision of normal pregnancy, unspecified, third trimester: Secondary | ICD-10-CM

## 2019-08-09 DIAGNOSIS — O35EXX0 Maternal care for other (suspected) fetal abnormality and damage, fetal genitourinary anomalies, not applicable or unspecified: Secondary | ICD-10-CM

## 2019-08-09 LAB — POCT URINALYSIS DIPSTICK OB
Bilirubin, UA: NEGATIVE
Blood, UA: NEGATIVE
Glucose, UA: NEGATIVE
Ketones, UA: NEGATIVE
Nitrite, UA: NEGATIVE
POC,PROTEIN,UA: NEGATIVE
Spec Grav, UA: 1.02 (ref 1.010–1.025)
Urobilinogen, UA: 0.2 E.U./dL
pH, UA: 6.5 (ref 5.0–8.0)

## 2019-08-09 NOTE — Patient Instructions (Signed)

## 2019-08-09 NOTE — Progress Notes (Signed)
ROB-Doing well, no questions or concerns. Fetal pyelectasis followed by MFM, next ultrasound scheduled 09/02/19. Anticipatory guidance regarding course of prenatal care. Reviewed red flag symptoms and when to call. RTC x 2 weeks for ROB or sooner if needed.

## 2019-08-09 NOTE — Progress Notes (Signed)
ROB-No complaints.  

## 2019-08-20 ENCOUNTER — Ambulatory Visit (INDEPENDENT_AMBULATORY_CARE_PROVIDER_SITE_OTHER): Payer: BC Managed Care – PPO | Admitting: Certified Nurse Midwife

## 2019-08-20 ENCOUNTER — Encounter: Payer: Self-pay | Admitting: Certified Nurse Midwife

## 2019-08-20 ENCOUNTER — Other Ambulatory Visit: Payer: Self-pay

## 2019-08-20 VITALS — BP 114/60 | HR 93 | Wt 161.4 lb

## 2019-08-20 DIAGNOSIS — Z3493 Encounter for supervision of normal pregnancy, unspecified, third trimester: Secondary | ICD-10-CM

## 2019-08-20 LAB — POCT URINALYSIS DIPSTICK OB
Bilirubin, UA: NEGATIVE
Blood, UA: NEGATIVE
Glucose, UA: NEGATIVE
Ketones, UA: NEGATIVE
Leukocytes, UA: NEGATIVE
Nitrite, UA: NEGATIVE
POC,PROTEIN,UA: NEGATIVE
Spec Grav, UA: 1.015 (ref 1.010–1.025)
Urobilinogen, UA: 0.2 E.U./dL
pH, UA: 5 (ref 5.0–8.0)

## 2019-08-20 NOTE — Progress Notes (Signed)
ROB doing well. Feels good movement. Movement observed with audible acceleration to 150's. Discussed weight gain with this pregnancy and recommendations. Encouraged her to monitor diet and exercise. She verbalizes understanding and agrees to plan. Follow up 2 wks with Melody   Philip Aspen, CNM

## 2019-08-20 NOTE — Patient Instructions (Signed)
How a Baby Grows During Pregnancy  Pregnancy begins when a female's sperm enters a female's egg (fertilization). Fertilization usually happens in one of the tubes (fallopian tubes) that connect the ovaries to the womb (uterus). The fertilized egg moves down the fallopian tube to the uterus. Once it reaches the uterus, it implants into the lining of the uterus and begins to grow. For the first 10 weeks, the fertilized egg is called an embryo. After 10 weeks, it is called a fetus. As the fetus continues to grow, it receives oxygen and nutrients through tissue (placenta) that grows to support the developing baby. The placenta is the life support system for the baby. It provides oxygen and nutrition and removes waste. Learning as much as you can about your pregnancy and how your baby is developing can help you enjoy the experience. It can also make you aware of when there might be a problem and when to ask questions. How long does a typical pregnancy last? A pregnancy usually lasts 280 days, or about 40 weeks. Pregnancy is divided into three periods of growth, also called trimesters:  First trimester: 0-12 weeks.  Second trimester: 13-27 weeks.  Third trimester: 28-40 weeks. The day when your baby is ready to be born (full term) is your estimated date of delivery. How does my baby develop month by month? First month  The fertilized egg attaches to the inside of the uterus.  Some cells will form the placenta. Others will form the fetus.  The arms, legs, brain, spinal cord, lungs, and heart begin to develop.  At the end of the first month, the heart begins to beat. Second month  The bones, inner ear, eyelids, hands, and feet form.  The genitals develop.  By the end of 8 weeks, all major organs are developing. Third month  All of the internal organs are forming.  Teeth develop below the gums.  Bones and muscles begin to grow. The spine can flex.  The skin is transparent.  Fingernails  and toenails begin to form.  Arms and legs continue to grow longer, and hands and feet develop.  The fetus is about 3 inches (7.6 cm) long. Fourth month  The placenta is completely formed.  The external sex organs, neck, outer ear, eyebrows, eyelids, and fingernails are formed.  The fetus can hear, swallow, and move its arms and legs.  The kidneys begin to produce urine.  The skin is covered with a white, waxy coating (vernix) and very fine hair (lanugo). Fifth month  The fetus moves around more and can be felt for the first time (quickening).  The fetus starts to sleep and wake up and may begin to suck its finger.  The nails grow to the end of the fingers.  The organ in the digestive system that makes bile (gallbladder) functions and helps to digest nutrients.  If your baby is a girl, eggs are present in her ovaries. If your baby is a boy, testicles start to move down into his scrotum. Sixth month  The lungs are formed.  The eyes open. The brain continues to develop.  Your baby has fingerprints and toe prints. Your baby's hair grows thicker.  At the end of the second trimester, the fetus is about 9 inches (22.9 cm) long. Seventh month  The fetus kicks and stretches.  The eyes are developed enough to sense changes in light.  The hands can make a grasping motion.  The fetus responds to sound. Eighth month  All   organs and body systems are fully developed and functioning.  Bones harden, and taste buds develop. The fetus may hiccup.  Certain areas of the brain are still developing. The skull remains soft. Ninth month  The fetus gains about  lb (0.23 kg) each week.  The lungs are fully developed.  Patterns of sleep develop.  The fetus's head typically moves into a head-down position (vertex) in the uterus to prepare for birth.  The fetus weighs 6-9 lb (2.72-4.08 kg) and is 19-20 inches (48.26-50.8 cm) long. What can I do to have a healthy pregnancy and help  my baby develop? General instructions  Take prenatal vitamins as directed by your health care provider. These include vitamins such as folic acid, iron, calcium, and vitamin D. They are important for healthy development.  Take medicines only as directed by your health care provider. Read labels and ask a pharmacist or your health care provider whether over-the-counter medicines, supplements, and prescription drugs are safe to take during pregnancy.  Keep all follow-up visits as directed by your health care provider. This is important. Follow-up visits include prenatal care and screening tests. How do I know if my baby is developing well? At each prenatal visit, your health care provider will do several different tests to check on your health and keep track of your baby's development. These include:  Fundal height and position. ? Your health care provider will measure your growing belly from your pubic bone to the top of the uterus using a tape measure. ? Your health care provider will also feel your belly to determine your baby's position.  Heartbeat. ? An ultrasound in the first trimester can confirm pregnancy and show a heartbeat, depending on how far along you are. ? Your health care provider will check your baby's heart rate at every prenatal visit.  Second trimester ultrasound. ? This ultrasound checks your baby's development. It also may show your baby's gender. What should I do if I have concerns about my baby's development? Always talk with your health care provider about any concerns that you may have about your pregnancy and your baby. Summary  A pregnancy usually lasts 280 days, or about 40 weeks. Pregnancy is divided into three periods of growth, also called trimesters.  Your health care provider will monitor your baby's growth and development throughout your pregnancy.  Follow your health care provider's recommendations about taking prenatal vitamins and medicines during  your pregnancy.  Talk with your health care provider if you have any concerns about your pregnancy or your developing baby. This information is not intended to replace advice given to you by your health care provider. Make sure you discuss any questions you have with your health care provider. Document Released: 05/23/2008 Document Revised: 03/28/2019 Document Reviewed: 10/18/2017 Elsevier Patient Education  2020 [employer]  

## 2019-08-29 ENCOUNTER — Other Ambulatory Visit: Payer: Self-pay

## 2019-08-29 DIAGNOSIS — O358XX0 Maternal care for other (suspected) fetal abnormality and damage, not applicable or unspecified: Secondary | ICD-10-CM

## 2019-08-29 DIAGNOSIS — O35EXX0 Maternal care for other (suspected) fetal abnormality and damage, fetal genitourinary anomalies, not applicable or unspecified: Secondary | ICD-10-CM

## 2019-09-02 ENCOUNTER — Other Ambulatory Visit: Payer: Self-pay

## 2019-09-02 ENCOUNTER — Ambulatory Visit
Admission: RE | Admit: 2019-09-02 | Discharge: 2019-09-02 | Disposition: A | Discharge status: Home / Self Care | Payer: BC Managed Care – PPO | Source: Ambulatory Visit | Attending: Maternal & Fetal Medicine | Admitting: Maternal & Fetal Medicine

## 2019-09-02 DIAGNOSIS — O35EXX0 Maternal care for other (suspected) fetal abnormality and damage, fetal genitourinary anomalies, not applicable or unspecified: Secondary | ICD-10-CM

## 2019-09-02 DIAGNOSIS — Z3A34 34 weeks gestation of pregnancy: Secondary | ICD-10-CM | POA: Diagnosis not present

## 2019-09-02 DIAGNOSIS — O358XX0 Maternal care for other (suspected) fetal abnormality and damage, not applicable or unspecified: Secondary | ICD-10-CM | POA: Diagnosis not present

## 2019-09-03 ENCOUNTER — Other Ambulatory Visit: Payer: Self-pay

## 2019-09-03 ENCOUNTER — Ambulatory Visit (INDEPENDENT_AMBULATORY_CARE_PROVIDER_SITE_OTHER): Payer: BC Managed Care – PPO | Admitting: Obstetrics and Gynecology

## 2019-09-03 VITALS — BP 103/66 | HR 90 | Wt 163.7 lb

## 2019-09-03 DIAGNOSIS — Z3493 Encounter for supervision of normal pregnancy, unspecified, third trimester: Secondary | ICD-10-CM

## 2019-09-03 DIAGNOSIS — O99019 Anemia complicating pregnancy, unspecified trimester: Secondary | ICD-10-CM

## 2019-09-03 DIAGNOSIS — Z23 Encounter for immunization: Secondary | ICD-10-CM

## 2019-09-03 LAB — POCT URINALYSIS DIPSTICK OB
Bilirubin, UA: NEGATIVE
Blood, UA: NEGATIVE
Glucose, UA: NEGATIVE
Ketones, UA: NEGATIVE
Leukocytes, UA: NEGATIVE
Nitrite, UA: NEGATIVE
Spec Grav, UA: 1.015 (ref 1.010–1.025)
Urobilinogen, UA: 0.2 E.U./dL
pH, UA: 6 (ref 5.0–8.0)

## 2019-09-03 NOTE — Progress Notes (Signed)
ROB- pt is doing well 

## 2019-09-03 NOTE — Progress Notes (Signed)
ROB- doing well, reviewed normal MFM u/s findings. Cultures next visit. Discussed flu vaccine -given today, discussed feeding preferences- plans bottle- she bottle fed her daughter and is not interested in BF, would consider pumping possibly.

## 2019-09-03 NOTE — Addendum Note (Signed)
Addended by: Keturah Barre L on: 09/03/2019 11:36 AM   Modules accepted: Orders

## 2019-09-16 ENCOUNTER — Other Ambulatory Visit: Payer: Self-pay

## 2019-09-17 ENCOUNTER — Other Ambulatory Visit: Payer: BC Managed Care – PPO

## 2019-09-17 ENCOUNTER — Ambulatory Visit (INDEPENDENT_AMBULATORY_CARE_PROVIDER_SITE_OTHER): Payer: BC Managed Care – PPO | Admitting: Certified Nurse Midwife

## 2019-09-17 ENCOUNTER — Other Ambulatory Visit: Payer: Self-pay

## 2019-09-17 VITALS — BP 103/73 | HR 117 | Wt 168.1 lb

## 2019-09-17 DIAGNOSIS — Z3685 Encounter for antenatal screening for Streptococcus B: Secondary | ICD-10-CM

## 2019-09-17 DIAGNOSIS — Z113 Encounter for screening for infections with a predominantly sexual mode of transmission: Secondary | ICD-10-CM

## 2019-09-17 DIAGNOSIS — Z3493 Encounter for supervision of normal pregnancy, unspecified, third trimester: Secondary | ICD-10-CM

## 2019-09-17 NOTE — Patient Instructions (Signed)
Fetal Movement Counts Patient Name: ________________________________________________ Patient Due Date: ____________________ What is a fetal movement count?  A fetal movement count is the number of times that you feel your baby move during a certain amount of time. This may also be called a fetal kick count. A fetal movement count is recommended for every pregnant woman. You may be asked to start counting fetal movements as early as week 28 of your pregnancy. Pay attention to when your baby is most active. You may notice your baby's sleep and wake cycles. You may also notice things that make your baby move more. You should do a fetal movement count:  When your baby is normally most active.  At the same time each day. A good time to count movements is while you are resting, after having something to eat and drink. How do I count fetal movements? 1. Find a quiet, comfortable area. Sit, or lie down on your side. 2. Write down the date, the start time and stop time, and the number of movements that you felt between those two times. Take this information with you to your health care visits. 3. For 2 hours, count kicks, flutters, swishes, rolls, and jabs. You should feel at least 10 movements during 2 hours. 4. You may stop counting after you have felt 10 movements. 5. If you do not feel 10 movements in 2 hours, have something to eat and drink. Then, keep resting and counting for 1 hour. If you feel at least 4 movements during that hour, you may stop counting. Contact a health care provider if:  You feel fewer than 4 movements in 2 hours.  Your baby is not moving like he or she usually does. Date: ____________ Start time: ____________ Stop time: ____________ Movements: ____________ Date: ____________ Start time: ____________ Stop time: ____________ Movements: ____________ Date: ____________ Start time: ____________ Stop time: ____________ Movements: ____________ Date: ____________ Start time:  ____________ Stop time: ____________ Movements: ____________ Date: ____________ Start time: ____________ Stop time: ____________ Movements: ____________ Date: ____________ Start time: ____________ Stop time: ____________ Movements: ____________ Date: ____________ Start time: ____________ Stop time: ____________ Movements: ____________ Date: ____________ Start time: ____________ Stop time: ____________ Movements: ____________ Date: ____________ Start time: ____________ Stop time: ____________ Movements: ____________ This information is not intended to replace advice given to you by your health care provider. Make sure you discuss any questions you have with your health care provider. Document Released: 01/04/2007 Document Revised: 12/25/2018 Document Reviewed: 01/14/2016 Elsevier Patient Education  2020 [employer]. Vaginal Delivery  Vaginal delivery means that you give birth by pushing your baby out of your birth canal (vagina). A team of health care providers will help you before, during, and after vaginal delivery. Birth experiences are unique for every woman and every pregnancy, and birth experiences vary depending on where you choose to give birth. What happens when I arrive at the birth center or hospital? Once you are in labor and have been admitted into the hospital or birth center, your health care provider may:  Review your pregnancy history and any concerns that you have.  Insert an IV into one of your veins. This may be used to give you fluids and medicines.  Check your blood pressure, pulse, temperature, and heart rate (vital signs).  Check whether your bag of water (amniotic sac) has broken (ruptured).  Talk with you about your birth plan and discuss pain control options. Monitoring Your health care provider may monitor your contractions (uterine monitoring) and your baby's heart  rate (fetal monitoring). You may need to be monitored:  Often, but not continuously  (intermittently).  All the time or for long periods at a time (continuously). Continuous monitoring may be needed if: ? You are taking certain medicines, such as medicine to relieve pain or make your contractions stronger. ? You have pregnancy or labor complications. Monitoring may be done by:  Placing a special stethoscope or a handheld monitoring device on your abdomen to check your baby's heartbeat and to check for contractions.  Placing monitors on your abdomen (external monitors) to record your baby's heartbeat and the frequency and length of contractions.  Placing monitors inside your uterus through your vagina (internal monitors) to record your baby's heartbeat and the frequency, length, and strength of your contractions. Depending on the type of monitor, it may remain in your uterus or on your baby's head until birth.  Telemetry. This is a type of continuous monitoring that can be done with external or internal monitors. Instead of having to stay in bed, you are able to move around during telemetry. Physical exam Your health care provider may perform frequent physical exams. This may include:  Checking how and where your baby is positioned in your uterus.  Checking your cervix to determine: ? Whether it is thinning out (effacing). ? Whether it is opening up (dilating). What happens during labor and delivery?  Normal labor and delivery is divided into the following three stages: Stage 1  This is the longest stage of labor.  This stage can last for hours or days.  Throughout this stage, you will feel contractions. Contractions generally feel mild, infrequent, and irregular at first. They get stronger, more frequent (about every 2-3 minutes), and more regular as you move through this stage.  This stage ends when your cervix is completely dilated to 4 inches (10 cm) and completely effaced. Stage 2  This stage starts once your cervix is completely effaced and dilated and lasts  until the delivery of your baby.  This stage may last from 20 minutes to 2 hours.  This is the stage where you will feel an urge to push your baby out of your vagina.  You may feel stretching and burning pain, especially when the widest part of your baby's head passes through the vaginal opening (crowning).  Once your baby is delivered, the umbilical cord will be clamped and cut. This usually occurs after waiting a period of 1-2 minutes after delivery.  Your baby will be placed on your bare chest (skin-to-skin contact) in an upright position and covered with a warm blanket. Watch your baby for feeding cues, like rooting or sucking, and help the baby to your breast for his or her first feeding. Stage 3  This stage starts immediately after the birth of your baby and ends after you deliver the placenta.  This stage may take anywhere from 5 to 30 minutes.  After your baby has been delivered, you will feel contractions as your body expels the placenta and your uterus contracts to control bleeding. What can I expect after labor and delivery?  After labor is over, you and your baby will be monitored closely until you are ready to go home to ensure that you are both healthy. Your health care team will teach you how to care for yourself and your baby.  You and your baby will stay in the same room (rooming in) during your hospital stay. This will encourage early bonding and successful breastfeeding.  You may continue  to receive fluids and medicines through an IV.  Your uterus will be checked and massaged regularly (fundal massage).  You will have some soreness and pain in your abdomen, vagina, and the area of skin between your vaginal opening and your anus (perineum).  If an incision was made near your vagina (episiotomy) or if you had some vaginal tearing during delivery, cold compresses may be placed on your episiotomy or your tear. This helps to reduce pain and swelling.  You may be given a  squirt bottle to use instead of wiping when you go to the bathroom. To use the squirt bottle, follow these steps: ? Before you urinate, fill the squirt bottle with warm water. Do not use hot water. ? After you urinate, while you are sitting on the toilet, use the squirt bottle to rinse the area around your urethra and vaginal opening. This rinses away any urine and blood. ? Fill the squirt bottle with clean water every time you use the bathroom.  It is normal to have vaginal bleeding after delivery. Wear a sanitary pad for vaginal bleeding and discharge. Summary  Vaginal delivery means that you will give birth by pushing your baby out of your birth canal (vagina).  Your health care provider may monitor your contractions (uterine monitoring) and your baby's heart rate (fetal monitoring).  Your health care provider may perform a physical exam.  Normal labor and delivery is divided into three stages.  After labor is over, you and your baby will be monitored closely until you are ready to go home. This information is not intended to replace advice given to you by your health care provider. Make sure you discuss any questions you have with your health care provider. Document Released: 09/13/2008 Document Revised: 01/09/2018 Document Reviewed: 01/09/2018 Elsevier Patient Education  2020 [employer] Inc.  

## 2019-09-17 NOTE — Progress Notes (Signed)
ROB-No complaints.  

## 2019-09-17 NOTE — Progress Notes (Signed)
ROB-Doing well. Cultures and anemia labs today, see orders. Request SVE. 36 week handouts provided. Anticipatory guidance regarding course of prenatal care. Reviewed red flag symptoms, signs of labor, and when to call. RTC x 1 week for ROB or sooner if needed.

## 2019-09-18 LAB — B12 AND FOLATE PANEL
Folate: 17.3 ng/mL (ref >3.0)
Vitamin B-12: 317 pg/mL (ref 232–1245)

## 2019-09-18 LAB — CBC
Hematocrit: 33.3 % — ABNORMAL LOW (ref 34.0–46.6)
Hemoglobin: 11.2 g/dL (ref 11.1–15.9)
MCH: 28.4 pg (ref 26.6–33.0)
MCHC: 33.6 g/dL (ref 31.5–35.7)
MCV: 85 fL (ref 79–97)
Platelets: 219 10*3/uL (ref 150–450)
RBC: 3.94 x10E6/uL (ref 3.77–5.28)
RDW: 13.9 % (ref 11.7–15.4)
WBC: 7.8 10*3/uL (ref 3.4–10.8)

## 2019-09-18 LAB — VITAMIN D 25 HYDROXY (VIT D DEFICIENCY, FRACTURES): Vit D, 25-Hydroxy: 40.9 ng/mL (ref 30.0–100.0)

## 2019-09-18 LAB — FERRITIN: Ferritin: 7 ng/mL — ABNORMAL LOW (ref 15–150)

## 2019-09-19 LAB — STREP GP B NAA: Strep Gp B NAA: NEGATIVE

## 2019-09-20 LAB — GC/CHLAMYDIA PROBE AMP
Chlamydia trachomatis, NAA: NEGATIVE
Neisseria Gonorrhoeae by PCR: NEGATIVE

## 2019-09-24 ENCOUNTER — Ambulatory Visit (INDEPENDENT_AMBULATORY_CARE_PROVIDER_SITE_OTHER): Payer: BC Managed Care – PPO | Admitting: Certified Nurse Midwife

## 2019-09-24 ENCOUNTER — Other Ambulatory Visit: Payer: Self-pay

## 2019-09-24 ENCOUNTER — Encounter: Payer: Self-pay | Admitting: Certified Nurse Midwife

## 2019-09-24 VITALS — BP 107/59 | HR 80 | Wt 168.5 lb

## 2019-09-24 DIAGNOSIS — Z3483 Encounter for supervision of other normal pregnancy, third trimester: Secondary | ICD-10-CM

## 2019-09-24 NOTE — Progress Notes (Signed)
ROB doing well. Feels good movement. Labor precautions reviewed. SVE 2/60/-2 . Follow up 1 wk.   Philip Aspen, CNM

## 2019-09-24 NOTE — Patient Instructions (Signed)
Braxton Hicks Contractions Contractions of the uterus can occur throughout pregnancy, but they are not always a sign that you are in labor. You may have practice contractions called Braxton Hicks contractions. These false labor contractions are sometimes confused with true labor. What are Braxton Hicks contractions? Braxton Hicks contractions are tightening movements that occur in the muscles of the uterus before labor. Unlike true labor contractions, these contractions do not result in opening (dilation) and thinning of the cervix. Toward the end of pregnancy (32-34 weeks), Braxton Hicks contractions can happen more often and may become stronger. These contractions are sometimes difficult to tell apart from true labor because they can be very uncomfortable. You should not feel embarrassed if you go to the hospital with false labor. Sometimes, the only way to tell if you are in true labor is for your health care provider to look for changes in the cervix. The health care provider will do a physical exam and may monitor your contractions. If you are not in true labor, the exam should show that your cervix is not dilating and your water has not broken. If there are no other health problems associated with your pregnancy, it is completely safe for you to be sent home with false labor. You may continue to have Braxton Hicks contractions until you go into true labor. How to tell the difference between true labor and false labor True labor  Contractions last 30-70 seconds.  Contractions become very regular.  Discomfort is usually felt in the top of the uterus, and it spreads to the lower abdomen and low back.  Contractions do not go away with walking.  Contractions usually become more intense and increase in frequency.  The cervix dilates and gets thinner. False labor  Contractions are usually shorter and not as strong as true labor contractions.  Contractions are usually irregular.  Contractions  are often felt in the front of the lower abdomen and in the groin.  Contractions may go away when you walk around or change positions while lying down.  Contractions get weaker and are shorter-lasting as time goes on.  The cervix usually does not dilate or become thin. Follow these instructions at home:   Take over-the-counter and prescription medicines only as told by your health care provider.  Keep up with your usual exercises and follow other instructions from your health care provider.  Eat and drink lightly if you think you are going into labor.  If Braxton Hicks contractions are making you uncomfortable: ? Change your position from lying down or resting to walking, or change from walking to resting. ? Sit and rest in a tub of warm water. ? Drink enough fluid to keep your urine pale yellow. Dehydration may cause these contractions. ? Do slow and deep breathing several times an hour.  Keep all follow-up prenatal visits as told by your health care provider. This is important. Contact a health care provider if:  You have a fever.  You have continuous pain in your abdomen. Get help right away if:  Your contractions become stronger, more regular, and closer together.  You have fluid leaking or gushing from your vagina.  You pass blood-tinged mucus (bloody show).  You have bleeding from your vagina.  You have low back pain that you never had before.  You feel your baby's head pushing down and causing pelvic pressure.  Your baby is not moving inside you as much as it used to. Summary  Contractions that occur before labor are   called Braxton Hicks contractions, false labor, or practice contractions.  Braxton Hicks contractions are usually shorter, weaker, farther apart, and less regular than true labor contractions. True labor contractions usually become progressively stronger and regular, and they become more frequent.  Manage discomfort from Braxton Hicks contractions  by changing position, resting in a warm bath, drinking plenty of water, or practicing deep breathing. This information is not intended to replace advice given to you by your health care provider. Make sure you discuss any questions you have with your health care provider. Document Released: 04/20/2017 Document Revised: 11/17/2017 Document Reviewed: 04/20/2017 Elsevier Patient Education  2020 [employer]  

## 2019-09-30 ENCOUNTER — Ambulatory Visit (INDEPENDENT_AMBULATORY_CARE_PROVIDER_SITE_OTHER): Payer: BC Managed Care – PPO | Admitting: Certified Nurse Midwife

## 2019-09-30 ENCOUNTER — Other Ambulatory Visit: Payer: Self-pay

## 2019-09-30 VITALS — BP 105/73 | HR 75 | Wt 170.0 lb

## 2019-09-30 DIAGNOSIS — Z3493 Encounter for supervision of normal pregnancy, unspecified, third trimester: Secondary | ICD-10-CM

## 2019-09-30 LAB — POCT URINALYSIS DIPSTICK OB
Bilirubin, UA: NEGATIVE
Blood, UA: NEGATIVE
Glucose, UA: NEGATIVE
Ketones, UA: NEGATIVE
Nitrite, UA: NEGATIVE
Spec Grav, UA: 1.01 (ref 1.010–1.025)
Urobilinogen, UA: 0.2 E.U./dL
pH, UA: 7.5 (ref 5.0–8.0)

## 2019-09-30 NOTE — Patient Instructions (Signed)

## 2019-09-30 NOTE — Progress Notes (Signed)
ROB-Doing well, no questions or concerns. Requests SVE, unchanged since previous exam. Using herbal prep. Discussed other home labor preparation techniques. Reviewed red flag symptoms and when to call. RTC x 1 week for ROB or sooner if needed.

## 2019-09-30 NOTE — Progress Notes (Signed)
ROB-No complaints.  

## 2019-10-07 ENCOUNTER — Encounter: Payer: Self-pay | Admitting: Certified Nurse Midwife

## 2019-10-07 ENCOUNTER — Other Ambulatory Visit: Payer: Self-pay

## 2019-10-07 ENCOUNTER — Ambulatory Visit (INDEPENDENT_AMBULATORY_CARE_PROVIDER_SITE_OTHER): Payer: BC Managed Care – PPO | Admitting: Certified Nurse Midwife

## 2019-10-07 VITALS — BP 103/71 | HR 94 | Wt 169.4 lb

## 2019-10-07 DIAGNOSIS — Z3493 Encounter for supervision of normal pregnancy, unspecified, third trimester: Secondary | ICD-10-CM

## 2019-10-07 DIAGNOSIS — Z3483 Encounter for supervision of other normal pregnancy, third trimester: Secondary | ICD-10-CM

## 2019-10-07 DIAGNOSIS — Z3A38 38 weeks gestation of pregnancy: Secondary | ICD-10-CM

## 2019-10-07 LAB — POCT URINALYSIS DIPSTICK OB
Bilirubin, UA: NEGATIVE
Blood, UA: NEGATIVE
Glucose, UA: NEGATIVE
Ketones, UA: NEGATIVE
Leukocytes, UA: NEGATIVE
Nitrite, UA: NEGATIVE
POC,PROTEIN,UA: NEGATIVE
Spec Grav, UA: 1.01 (ref 1.010–1.025)
Urobilinogen, UA: 0.2 E.U./dL
pH, UA: 5 (ref 5.0–8.0)

## 2019-10-07 NOTE — Progress Notes (Signed)
ROB doing well. Feels good movement. Discussed u/s Thursday for AFI & growth . Discussed pt for induction at that appointment. She verbalizes and agrees to plan. Labor precautions reviewed. SVE 3/70/-2. Follow up Thursday.

## 2019-10-07 NOTE — Patient Instructions (Signed)
Braxton Hicks Contractions Contractions of the uterus can occur throughout pregnancy, but they are not always a sign that you are in labor. You may have practice contractions called Braxton Hicks contractions. These false labor contractions are sometimes confused with true labor. What are Braxton Hicks contractions? Braxton Hicks contractions are tightening movements that occur in the muscles of the uterus before labor. Unlike true labor contractions, these contractions do not result in opening (dilation) and thinning of the cervix. Toward the end of pregnancy (32-34 weeks), Braxton Hicks contractions can happen more often and may become stronger. These contractions are sometimes difficult to tell apart from true labor because they can be very uncomfortable. You should not feel embarrassed if you go to the hospital with false labor. Sometimes, the only way to tell if you are in true labor is for your health care provider to look for changes in the cervix. The health care provider will do a physical exam and may monitor your contractions. If you are not in true labor, the exam should show that your cervix is not dilating and your water has not broken. If there are no other health problems associated with your pregnancy, it is completely safe for you to be sent home with false labor. You may continue to have Braxton Hicks contractions until you go into true labor. How to tell the difference between true labor and false labor True labor  Contractions last 30-70 seconds.  Contractions become very regular.  Discomfort is usually felt in the top of the uterus, and it spreads to the lower abdomen and low back.  Contractions do not go away with walking.  Contractions usually become more intense and increase in frequency.  The cervix dilates and gets thinner. False labor  Contractions are usually shorter and not as strong as true labor contractions.  Contractions are usually irregular.  Contractions  are often felt in the front of the lower abdomen and in the groin.  Contractions may go away when you walk around or change positions while lying down.  Contractions get weaker and are shorter-lasting as time goes on.  The cervix usually does not dilate or become thin. Follow these instructions at home:   Take over-the-counter and prescription medicines only as told by your health care provider.  Keep up with your usual exercises and follow other instructions from your health care provider.  Eat and drink lightly if you think you are going into labor.  If Braxton Hicks contractions are making you uncomfortable: ? Change your position from lying down or resting to walking, or change from walking to resting. ? Sit and rest in a tub of warm water. ? Drink enough fluid to keep your urine pale yellow. Dehydration may cause these contractions. ? Do slow and deep breathing several times an hour.  Keep all follow-up prenatal visits as told by your health care provider. This is important. Contact a health care provider if:  You have a fever.  You have continuous pain in your abdomen. Get help right away if:  Your contractions become stronger, more regular, and closer together.  You have fluid leaking or gushing from your vagina.  You pass blood-tinged mucus (bloody show).  You have bleeding from your vagina.  You have low back pain that you never had before.  You feel your baby's head pushing down and causing pelvic pressure.  Your baby is not moving inside you as much as it used to. Summary  Contractions that occur before labor are   called Braxton Hicks contractions, false labor, or practice contractions.  Braxton Hicks contractions are usually shorter, weaker, farther apart, and less regular than true labor contractions. True labor contractions usually become progressively stronger and regular, and they become more frequent.  Manage discomfort from Braxton Hicks contractions  by changing position, resting in a warm bath, drinking plenty of water, or practicing deep breathing. This information is not intended to replace advice given to you by your health care provider. Make sure you discuss any questions you have with your health care provider. Document Released: 04/20/2017 Document Revised: 11/17/2017 Document Reviewed: 04/20/2017 Elsevier Patient Education  2020 [employer]  

## 2019-10-10 ENCOUNTER — Other Ambulatory Visit: Payer: Self-pay

## 2019-10-10 ENCOUNTER — Ambulatory Visit (INDEPENDENT_AMBULATORY_CARE_PROVIDER_SITE_OTHER): Payer: BC Managed Care – PPO | Admitting: Certified Nurse Midwife

## 2019-10-10 ENCOUNTER — Ambulatory Visit (INDEPENDENT_AMBULATORY_CARE_PROVIDER_SITE_OTHER): Payer: BC Managed Care – PPO

## 2019-10-10 VITALS — BP 94/70 | HR 115 | Wt 171.0 lb

## 2019-10-10 DIAGNOSIS — Z362 Encounter for other antenatal screening follow-up: Secondary | ICD-10-CM | POA: Diagnosis not present

## 2019-10-10 DIAGNOSIS — Z3A40 40 weeks gestation of pregnancy: Secondary | ICD-10-CM | POA: Diagnosis not present

## 2019-10-10 DIAGNOSIS — Z3493 Encounter for supervision of normal pregnancy, unspecified, third trimester: Secondary | ICD-10-CM

## 2019-10-10 DIAGNOSIS — Z3483 Encounter for supervision of other normal pregnancy, third trimester: Secondary | ICD-10-CM

## 2019-10-10 LAB — POCT URINALYSIS DIPSTICK OB
Bilirubin, UA: NEGATIVE
Blood, UA: NEGATIVE
Glucose, UA: NEGATIVE
Ketones, UA: NEGATIVE
Nitrite, UA: NEGATIVE
Spec Grav, UA: 1.02 (ref 1.010–1.025)
Urobilinogen, UA: 0.2 E.U./dL
pH, UA: 6 (ref 5.0–8.0)

## 2019-10-10 NOTE — Patient Instructions (Addendum)
Fetal Movement Counts Patient Name: ________________________________________________ Patient Due Date: ____________________ What is a fetal movement count?  A fetal movement count is the number of times that you feel your baby move during a certain amount of time. This may also be called a fetal kick count. A fetal movement count is recommended for every pregnant woman. You may be asked to start counting fetal movements as early as week 28 of your pregnancy. Pay attention to when your baby is most active. You may notice your baby's sleep and wake cycles. You may also notice things that make your baby move more. You should do a fetal movement count:  When your baby is normally most active.  At the same time each day. A good time to count movements is while you are resting, after having something to eat and drink. How do I count fetal movements? 1. Find a quiet, comfortable area. Sit, or lie down on your side. 2. Write down the date, the start time and stop time, and the number of movements that you felt between those two times. Take this information with you to your health care visits. 3. For 2 hours, count kicks, flutters, swishes, rolls, and jabs. You should feel at least 10 movements during 2 hours. 4. You may stop counting after you have felt 10 movements. 5. If you do not feel 10 movements in 2 hours, have something to eat and drink. Then, keep resting and counting for 1 hour. If you feel at least 4 movements during that hour, you may stop counting. Contact a health care provider if:  You feel fewer than 4 movements in 2 hours.  Your baby is not moving like he or she usually does. Date: ____________ Start time: ____________ Stop time: ____________ Movements: ____________ Date: ____________ Start time: ____________ Stop time: ____________ Movements: ____________ Date: ____________ Start time: ____________ Stop time: ____________ Movements: ____________ Date: ____________ Start time:  ____________ Stop time: ____________ Movements: ____________ Date: ____________ Start time: ____________ Stop time: ____________ Movements: ____________ Date: ____________ Start time: ____________ Stop time: ____________ Movements: ____________ Date: ____________ Start time: ____________ Stop time: ____________ Movements: ____________ Date: ____________ Start time: ____________ Stop time: ____________ Movements: ____________ Date: ____________ Start time: ____________ Stop time: ____________ Movements: ____________ This information is not intended to replace advice given to you by your health care provider. Make sure you discuss any questions you have with your health care provider. Document Released: 01/04/2007 Document Revised: 12/25/2018 Document Reviewed: 01/14/2016 Elsevier Patient Education  [place].   Nonstress Test A nonstress test is a procedure that is done during pregnancy in order to check the baby's heartbeat. The procedure can help show if the baby (fetus) is healthy. It is commonly done when:  The baby is past his or her due date.  The pregnancy is high risk.  The baby is moving less than normal.  The mother has lost a pregnancy in the past.  The health care provider suspects a problem with the baby's growth.  There is too much or too little amniotic fluid. The procedure is often done in the third trimester of pregnancy to find out if an early delivery is needed and whether such a delivery is safe. During a nonstress test, the baby's heartbeat is monitored when the baby is resting and when the baby is moving. If the baby is healthy, the heart rate will increase when he or she moves or kicks and will return to normal when he or she rests. Tell a  health care provider about:  Any allergies you have.  Any medical conditions you have.  All medicines you are taking, including vitamins, herbs, eye drops, creams, and over-the-counter medicines. What are the risks?  There are no risks to you or your baby from a nonstress test. This procedure should not be painful or uncomfortable. What happens before the procedure?  Eat a meal right before the test or as directed by your health care provider. Food may help encourage the baby to move.  Use the restroom right before the test. What happens during the procedure?  Two monitors will be placed on your abdomen. One will record the baby's heart rate and the other will record the contractions of your uterus.  You may be asked to lie down on your side or to sit upright.  You may be given a button to press when you feel your baby move.  Your health care provider will listen to your baby's heartbeat and recorded it. He or she may also watch your baby's heartbeat on a screen.  If the baby seems to be sleeping, you may be asked to drink some juice or soda, eat a snack, or change positions. The procedure may vary among health care providers and hospitals. What happens after the procedure?  Your health care provider will discuss the test results with you and make recommendations for the future. Depending on the results, your health care provider may order additional tests or another course of action.  If your health care provider gave you any diet or activity instructions, make sure to follow them.  Keep all follow-up visits as told by your health care provider. This is important. Summary  A nonstress test is a procedure that is done during pregnancy in order to check the baby's heartbeat. The procedure can help show if the baby is healthy.  The procedure is often done in the third trimester of pregnancy to find out if an early delivery is needed and whether such a delivery is safe.  During a nonstress test, the baby's heartbeat is monitored when the baby is resting and when the baby is moving. If the baby is healthy, the heart rate will increase when he or she moves or kicks and will return to normal when he or  she rests.  Your health care provider will discuss the test results with you and make recommendations for the future. This information is not intended to replace advice given to you by your health care provider. Make sure you discuss any questions you have with your health care provider. Document Released: 11/25/2002 Document Revised: 03/16/2017 Document Reviewed: 03/16/2017 Elsevier Patient Education  [place] induction is when steps are taken to cause a pregnant woman to begin the labor process. Most women go into labor on their own between 37 weeks and 42 weeks of pregnancy. When this does not happen or when there is a medical need for labor to begin, steps may be taken to induce labor. Labor induction causes a pregnant woman's uterus to contract. It also causes the cervix to soften (ripen), open (dilate), and thin out (efface). Usually, labor is not induced before 39 weeks of pregnancy unless there is a medical reason to do so. Your health care provider will determine if labor induction is needed. Before inducing labor, your health care provider will consider a number of factors, including:  Your medical condition and your baby's.  How many weeks along you are in  your pregnancy.  How mature your baby's lungs are.  The condition of your cervix.  The position of your baby.  The size of your birth canal. What are some reasons for labor induction? Labor may be induced if:  Your health or your baby's health is at risk.  Your pregnancy is overdue by 1 week or more.  Your water breaks but labor does not start on its own.  There is a low amount of amniotic fluid around your baby. You may also choose (elect) to have labor induced at a certain time. Generally, elective labor induction is done no earlier than 39 weeks of pregnancy. What methods are used for labor induction? Methods used for labor induction include:  Prostaglandin medicine. This  medicine starts contractions and causes the cervix to dilate and ripen. It can be taken by mouth (orally) or by being inserted into the vagina (suppository).  Inserting a small, thin tube (catheter) with a balloon into the vagina and then expanding the balloon with water to dilate the cervix.  Stripping the membranes. In this method, your health care provider gently separates amniotic sac tissue from the cervix. This causes the cervix to stretch, which in turn causes the release of a hormone called progesterone. The hormone causes the uterus to contract. This procedure is often done during an office visit, after which you will be sent home to wait for contractions to begin.  Breaking the water. In this method, your health care provider uses a small instrument to make a small hole in the amniotic sac. This eventually causes the amniotic sac to break. Contractions should begin after a few hours.  Medicine to trigger or strengthen contractions. This medicine is given through an IV that is inserted into a vein in your arm. Except for membrane stripping, which can be done in a clinic, labor induction is done in the hospital so that you and your baby can be carefully monitored. How long does it take for labor to be induced? The length of time it takes to induce labor depends on how ready your body is for labor. Some inductions can take up to 2-3 days, while others may take less than a day. Induction may take longer if:  You are induced early in your pregnancy.  It is your first pregnancy.  Your cervix is not ready. What are some risks associated with labor induction? Some risks associated with labor induction include:  Changes in fetal heart rate, such as being too high, too low, or irregular (erratic).  Failed induction.  Infection in the mother or the baby.  Increased risk of having a cesarean delivery.  Fetal death.  Breaking off (abruption) of the placenta from the uterus (rare).   Rupture of the uterus (very rare). When induction is needed for medical reasons, the benefits of induction generally outweigh the risks. What are some reasons for not inducing labor? Labor induction should not be done if:  Your baby does not tolerate contractions.  You have had previous surgeries on your uterus, such as a myomectomy, removal of fibroids, or a vertical scar from a previous cesarean delivery.  Your placenta lies very low in your uterus and blocks the opening of the cervix (placenta previa).  Your baby is not in a head-down position.  The umbilical cord drops down into the birth canal in front of the baby.  There are unusual circumstances, such as the baby being very early (premature).  You have had more than 2 previous cesarean  deliveries. Summary  Labor induction is when steps are taken to cause a pregnant woman to begin the labor process.  Labor induction causes a pregnant woman's uterus to contract. It also causes the cervix to ripen, dilate, and efface.  Labor is not induced before 39 weeks of pregnancy unless there is a medical reason to do so.  When induction is needed for medical reasons, the benefits of induction generally outweigh the risks. This information is not intended to replace advice given to you by your health care provider. Make sure you discuss any questions you have with your health care provider. Document Released: 04/26/2007 Document Revised: 12/08/2017 Document Reviewed: 01/18/2017 Elsevier Patient Education  2020 [employer].

## 2019-10-10 NOTE — Progress Notes (Signed)
ROB-No complaints.  

## 2019-10-10 NOTE — Progress Notes (Signed)
ROB-Doing well, no questions or concerns. Growth and AFI wnl, see below. Findings discussed with patient, verbalized understanding. Education regarding postdates care. IOL scheduled on 10/16/2019 at 0500, orders placed. Reviewed red flag symptoms, signs of labor and when to call. RTC x Monday for NST and ROB or sooner if needed.   ULTRASOUND REPORT  Location: [facility] Date of Service: 10/10/2019   Indications:growth/afi Findings:  Wanda Sanchez intrauterine pregnancy is visualized with FHR at 132 BPM. Biometrics give an (U/S) Gestational age of [redacted]w[redacted]d and an (U/S) EDD of 10/10/2019; this correlates with the clinically established Estimated Date of Delivery: 10/10/19.  Fetal presentation is Cephalic.  Placenta: anterior. Grade: 1 AFI: 16.2cm  Growth percentile is 87. EFW: 4042 g ( 8 lbs 15 oz)   Impression: 1. [redacted]w[redacted]d Viable Singleton Intrauterine pregnancy previously established criteria. 2. Growth is 87 %ile.  AFI is 16.2 cm.   Recommendations: 1.Clinical correlation with the patient's History and Physical Exam.

## 2019-10-14 ENCOUNTER — Other Ambulatory Visit
Admission: RE | Admit: 2019-10-14 | Discharge: 2019-10-14 | Disposition: A | Discharge status: Home / Self Care | Payer: BC Managed Care – PPO | Source: Ambulatory Visit | Attending: Certified Nurse Midwife | Admitting: Certified Nurse Midwife

## 2019-10-14 ENCOUNTER — Other Ambulatory Visit: Payer: Self-pay

## 2019-10-14 ENCOUNTER — Ambulatory Visit (INDEPENDENT_AMBULATORY_CARE_PROVIDER_SITE_OTHER): Payer: BC Managed Care – PPO | Admitting: Certified Nurse Midwife

## 2019-10-14 ENCOUNTER — Other Ambulatory Visit: Payer: BC Managed Care – PPO

## 2019-10-14 VITALS — BP 100/65 | HR 72 | Wt 170.2 lb

## 2019-10-14 DIAGNOSIS — Z3A40 40 weeks gestation of pregnancy: Secondary | ICD-10-CM | POA: Diagnosis not present

## 2019-10-14 DIAGNOSIS — Z01812 Encounter for preprocedural laboratory examination: Secondary | ICD-10-CM | POA: Insufficient documentation

## 2019-10-14 DIAGNOSIS — O480 Post-term pregnancy: Secondary | ICD-10-CM | POA: Diagnosis not present

## 2019-10-14 DIAGNOSIS — Z20828 Contact with and (suspected) exposure to other viral communicable diseases: Secondary | ICD-10-CM | POA: Diagnosis not present

## 2019-10-14 LAB — POCT URINALYSIS DIPSTICK OB
Bilirubin, UA: NEGATIVE
Blood, UA: NEGATIVE
Glucose, UA: NEGATIVE
Ketones, UA: NEGATIVE
Nitrite, UA: NEGATIVE
POC,PROTEIN,UA: NEGATIVE
Spec Grav, UA: 1.01 (ref 1.010–1.025)
Urobilinogen, UA: 0.2 E.U./dL
pH, UA: 8 (ref 5.0–8.0)

## 2019-10-14 LAB — SARS CORONAVIRUS 2 (TAT 6-24 HRS): SARS Coronavirus 2: NEGATIVE

## 2019-10-14 NOTE — Progress Notes (Signed)
ROB and NST-Doing well, no questions or concerns. Discussed IOL and COVID policies. Reviewed red flag symptoms, signs of labor, and when to call. RTC x 2-3 wks for virtual postpartum mood check. RTC x 7 wks for PPV with MNS or sooner if needed.   NONSTRESS TEST INTERPRETATION  INDICATIONS: Postdates  FHR baseline: 120-125 bpm RESULTS:Reactive COMMENTS: Irregular contractions   PLAN: 1. Continue fetal kick counts twice a day. 2. IOL scheduled for Wednesday, 10/16/2019 at [place], CNM [facility], [facility] 10/14/19 9:33 AM

## 2019-10-14 NOTE — Patient Instructions (Signed)
Augmentation of Labor  Augmentation of labor is when steps are taken to stimulate and strengthen contractions of the uterus during labor. This may be done when contractions have slowed down or stopped, delaying progress of labor and delivery of the baby. Before beginning augmentation of labor, your health care provider will evaluate your condition, your baby's condition, the size and position of your baby, and the size of your birth canal. What are some reasons for labor augmentation? Augmentation of labor may be needed when:  You are in labor but your contractions are weak or irregular.  You are in labor but your contractions have stopped. What methods are used for labor augmentation? Labor augmentation may be done by:  Giving medicine that stimulates contractions (oxytocin). This is given through an IV tube that is inserted into one of your veins.  Breaking the fluid-filled sac that surrounds the fetus (amniotic sac). What are the risks associated with labor augmentation? Some risks of labor augmentation include:  Too much stimulation of the contractions, resulting in continuous, prolonged, or very strong contractions.  Increased risk of infection for you and your baby.  Tearing (rupture) of the uterus.  Breaking off (abruption) of the placenta.  Increased risk of cesarean, forceps, or vacuum delivery.  Excessive bleeding after delivery (postpartum hemorrhage).  Death of the baby (fetal death). What are some reasons for not doing labor augmentation? Augmentation of labor should not be done if:  The baby is too big for the birth canal. This can be confirmed with an ultrasound.  The umbilical cord drops in front of the baby's head or breech part (prolapsed cord).  You have had a cesarean delivery and you had a vertical incision or you do not know what type of incision you had.  You have had surgery on or into your uterus.  You have an active herpes outbreak.  You have  cervical cancer.  The placenta blocks the opening of the cervix (placenta previa) or you have other condition that is blocking the cervix or vaginal outlet.  The baby is lying sideways.  Your pelvis is will not permit the passage of the baby.  You are carrying more than two babies. Summary  Augmentation of labor is when steps are taken to stimulate and strengthen contractions of the uterus during labor. This may be done when contractions have slowed down or stopped, delaying progress of labor and delivery of the baby.  Labor augmentation may be done using medicine to stimulate contractions (oxytocin) or by breaking the fluid-filled sac that surrounds the fetus (amniotic sac).  Labor should not be augmented if you have had a cesarean delivery and you had a vertical incision or you do not know what type of incision you had. This information is not intended to replace advice given to you by your health care provider. Make sure you discuss any questions you have with your health care provider. Document Released: 05/30/2007 Document Revised: 11/17/2017 Document Reviewed: 01/09/2017 Elsevier Patient Education  2020 [place]. Fetal Movement Counts Patient Name: ________________________________________________ Patient Due Date: ____________________ What is a fetal movement count?  A fetal movement count is the number of times that you feel your baby move during a certain amount of time. This may also be called a fetal kick count. A fetal movement count is recommended for every pregnant woman. You may be asked to start counting fetal movements as early as week 28 of your pregnancy. Pay attention to when your baby is most active. You may  notice your baby's sleep and wake cycles. You may also notice things that make your baby move more. You should do a fetal movement count:  When your baby is normally most active.  At the same time each day. A good time to count movements is while you are  resting, after having something to eat and drink. How do I count fetal movements? 1. Find a quiet, comfortable area. Sit, or lie down on your side. 2. Write down the date, the start time and stop time, and the number of movements that you felt between those two times. Take this information with you to your health care visits. 3. For 2 hours, count kicks, flutters, swishes, rolls, and jabs. You should feel at least 10 movements during 2 hours. 4. You may stop counting after you have felt 10 movements. 5. If you do not feel 10 movements in 2 hours, have something to eat and drink. Then, keep resting and counting for 1 hour. If you feel at least 4 movements during that hour, you may stop counting. Contact a health care provider if:  You feel fewer than 4 movements in 2 hours.  Your baby is not moving like he or she usually does. Date: ____________ Start time: ____________ Stop time: ____________ Movements: ____________ Date: ____________ Start time: ____________ Stop time: ____________ Movements: ____________ Date: ____________ Start time: ____________ Stop time: ____________ Movements: ____________ Date: ____________ Start time: ____________ Stop time: ____________ Movements: ____________ Date: ____________ Start time: ____________ Stop time: ____________ Movements: ____________ Date: ____________ Start time: ____________ Stop time: ____________ Movements: ____________ Date: ____________ Start time: ____________ Stop time: ____________ Movements: ____________ Date: ____________ Start time: ____________ Stop time: ____________ Movements: ____________ Date: ____________ Start time: ____________ Stop time: ____________ Movements: ____________ This information is not intended to replace advice given to you by your health care provider. Make sure you discuss any questions you have with your health care provider. Document Released: 01/04/2007 Document Revised: 12/25/2018 Document Reviewed: 01/14/2016  Elsevier Patient Education  2020 [employer].

## 2019-10-15 ENCOUNTER — Other Ambulatory Visit: Payer: Self-pay

## 2019-10-16 ENCOUNTER — Encounter
Admission: EM | Disposition: A | Discharge status: Home / Self Care | Payer: Self-pay | Source: Home / Self Care | Attending: Obstetrics and Gynecology

## 2019-10-16 ENCOUNTER — Inpatient Hospital Stay
Admission: EM | Admit: 2019-10-16 | Discharge: 2019-10-17 | DRG: 807 | Disposition: A | Discharge status: Home / Self Care | Payer: BC Managed Care – PPO | Attending: Obstetrics and Gynecology | Admitting: Obstetrics and Gynecology

## 2019-10-16 ENCOUNTER — Other Ambulatory Visit: Payer: Self-pay

## 2019-10-16 DIAGNOSIS — Z3490 Encounter for supervision of normal pregnancy, unspecified, unspecified trimester: Secondary | ICD-10-CM | POA: Diagnosis present

## 2019-10-16 DIAGNOSIS — O9902 Anemia complicating childbirth: Secondary | ICD-10-CM | POA: Diagnosis present

## 2019-10-16 DIAGNOSIS — O480 Post-term pregnancy: Principal | ICD-10-CM | POA: Diagnosis present

## 2019-10-16 DIAGNOSIS — O99019 Anemia complicating pregnancy, unspecified trimester: Secondary | ICD-10-CM

## 2019-10-16 DIAGNOSIS — Z3A40 40 weeks gestation of pregnancy: Secondary | ICD-10-CM

## 2019-10-16 DIAGNOSIS — D649 Anemia, unspecified: Secondary | ICD-10-CM | POA: Diagnosis present

## 2019-10-16 HISTORY — DX: Other specified health status: Z78.9

## 2019-10-16 LAB — CBC
HCT: 36.6 % (ref 36.0–46.0)
Hemoglobin: 11.7 g/dL — ABNORMAL LOW (ref 12.0–15.0)
MCH: 28.4 pg (ref 26.0–34.0)
MCHC: 32 g/dL (ref 30.0–36.0)
MCV: 88.8 fL (ref 80.0–100.0)
Platelets: 219 10*3/uL (ref 150–400)
RBC: 4.12 MIL/uL (ref 3.87–5.11)
RDW: 16.1 % — ABNORMAL HIGH (ref 11.5–15.5)
WBC: 7.6 10*3/uL (ref 4.0–10.5)
nRBC: 0 % (ref 0.0–0.2)

## 2019-10-16 LAB — RPR: RPR Ser Ql: NONREACTIVE

## 2019-10-16 LAB — TYPE AND SCREEN
ABO/RH(D): A POS
Antibody Screen: NEGATIVE

## 2019-10-16 SURGERY — Surgical Case
Anesthesia: Choice

## 2019-10-16 MED ORDER — LACTATED RINGERS AMNIOINFUSION
INTRAVENOUS | Status: DC
  Administered 2019-10-16: 500 mL via INTRAUTERINE
  Filled 2019-10-16 (×15): qty 1000

## 2019-10-16 MED ORDER — OXYTOCIN 40 UNITS IN NORMAL SALINE INFUSION - SIMPLE MED: 1.0000 m[IU]/min | INTRAVENOUS | Status: DC

## 2019-10-16 MED ORDER — AMMONIA AROMATIC IN INHA
RESPIRATORY_TRACT | Status: AC
  Filled 2019-10-16: qty 10

## 2019-10-16 MED ORDER — WITCH HAZEL-GLYCERIN EX PADS: 1.0000 "application " | MEDICATED_PAD | CUTANEOUS | Status: DC | PRN

## 2019-10-16 MED ORDER — DIPHENHYDRAMINE HCL 25 MG PO CAPS: 25.0000 mg | ORAL_CAPSULE | Freq: Four times a day (QID) | ORAL | Status: DC | PRN

## 2019-10-16 MED ORDER — MISOPROSTOL 100 MCG PO TABS
50.0000 ug | ORAL_TABLET | ORAL | Status: DC
  Administered 2019-10-16: 50 ug via ORAL
  Filled 2019-10-16 (×3): qty 1

## 2019-10-16 MED ORDER — BENZOCAINE-MENTHOL 20-0.5 % EX AERO
1.0000 "application " | INHALATION_SPRAY | CUTANEOUS | Status: DC | PRN
  Administered 2019-10-16: 1 via TOPICAL
  Filled 2019-10-16: qty 56

## 2019-10-16 MED ORDER — SIMETHICONE 80 MG PO CHEW: 80.0000 mg | CHEWABLE_TABLET | ORAL | Status: DC | PRN

## 2019-10-16 MED ORDER — TERBUTALINE SULFATE 1 MG/ML IJ SOLN
INTRAMUSCULAR | Status: AC
  Filled 2019-10-16: qty 1

## 2019-10-16 MED ORDER — ACETAMINOPHEN 325 MG PO TABS
650.0000 mg | ORAL_TABLET | ORAL | Status: DC | PRN
  Administered 2019-10-16: 650 mg via ORAL
  Filled 2019-10-16: qty 2

## 2019-10-16 MED ORDER — TERBUTALINE SULFATE 1 MG/ML IJ SOLN
0.2500 mg | Freq: Once | INTRAMUSCULAR | Status: AC | PRN
  Administered 2019-10-16: 0.25 mg via SUBCUTANEOUS
  Filled 2019-10-16: qty 1

## 2019-10-16 MED ORDER — LIDOCAINE HCL (PF) 1 % IJ SOLN
30.0000 mL | INTRAMUSCULAR | Status: DC | PRN
  Filled 2019-10-16: qty 30

## 2019-10-16 MED ORDER — SODIUM CHLORIDE 0.9% FLUSH: 3.0000 mL | Freq: Two times a day (BID) | INTRAVENOUS | Status: DC

## 2019-10-16 MED ORDER — MISOPROSTOL 200 MCG PO TABS
ORAL_TABLET | ORAL | Status: AC
  Filled 2019-10-16: qty 4

## 2019-10-16 MED ORDER — DOCUSATE SODIUM 100 MG PO CAPS
100.0000 mg | ORAL_CAPSULE | Freq: Two times a day (BID) | ORAL | Status: DC
  Administered 2019-10-16 – 2019-10-17 (×2): 100 mg via ORAL
  Filled 2019-10-16 (×2): qty 1

## 2019-10-16 MED ORDER — COCONUT OIL OIL: 1.0000 "application " | TOPICAL_OIL | Status: DC | PRN

## 2019-10-16 MED ORDER — LACTATED RINGERS IV SOLN
500.0000 mL | INTRAVENOUS | Status: DC | PRN
  Administered 2019-10-16: 500 mL via INTRAVENOUS
  Administered 2019-10-16: 1000 mL via INTRAVENOUS

## 2019-10-16 MED ORDER — OXYCODONE HCL 5 MG PO TABS: 5.0000 mg | ORAL_TABLET | ORAL | Status: DC | PRN

## 2019-10-16 MED ORDER — SODIUM CHLORIDE 0.9% FLUSH: 3.0000 mL | INTRAVENOUS | Status: DC | PRN

## 2019-10-16 MED ORDER — OXYCODONE-ACETAMINOPHEN 5-325 MG PO TABS: 2.0000 | ORAL_TABLET | ORAL | Status: DC | PRN

## 2019-10-16 MED ORDER — PRENATAL MULTIVITAMIN CH
1.0000 | ORAL_TABLET | Freq: Every day | ORAL | Status: DC
  Administered 2019-10-17: 1 via ORAL
  Filled 2019-10-16: qty 1

## 2019-10-16 MED ORDER — OXYTOCIN BOLUS FROM INFUSION
500.0000 mL | Freq: Once | INTRAVENOUS | Status: AC
  Administered 2019-10-16: 500 mL via INTRAVENOUS

## 2019-10-16 MED ORDER — ZOLPIDEM TARTRATE 5 MG PO TABS: 5.0000 mg | ORAL_TABLET | Freq: Every evening | ORAL | Status: DC | PRN

## 2019-10-16 MED ORDER — ONDANSETRON HCL 4 MG/2ML IJ SOLN: 4.0000 mg | INTRAMUSCULAR | Status: DC | PRN

## 2019-10-16 MED ORDER — BUTORPHANOL TARTRATE 1 MG/ML IJ SOLN: 1.0000 mg | INTRAMUSCULAR | Status: DC | PRN

## 2019-10-16 MED ORDER — OXYTOCIN 40 UNITS IN NORMAL SALINE INFUSION - SIMPLE MED
2.5000 [IU]/h | INTRAVENOUS | Status: DC
  Administered 2019-10-16: 2.5 [IU]/h via INTRAVENOUS
  Filled 2019-10-16: qty 1000

## 2019-10-16 MED ORDER — ACETAMINOPHEN 325 MG PO TABS: 650.0000 mg | ORAL_TABLET | ORAL | Status: DC | PRN

## 2019-10-16 MED ORDER — SOD CITRATE-CITRIC ACID 500-334 MG/5ML PO SOLN: 30.0000 mL | ORAL | Status: DC | PRN

## 2019-10-16 MED ORDER — OXYTOCIN 10 UNIT/ML IJ SOLN
INTRAMUSCULAR | Status: AC
  Filled 2019-10-16: qty 2

## 2019-10-16 MED ORDER — ONDANSETRON HCL 4 MG/2ML IJ SOLN: 4.0000 mg | Freq: Four times a day (QID) | INTRAMUSCULAR | Status: DC | PRN

## 2019-10-16 MED ORDER — LACTATED RINGERS IV SOLN
INTRAVENOUS | Status: DC
  Administered 2019-10-16 (×2): via INTRAVENOUS

## 2019-10-16 MED ORDER — OXYCODONE HCL 5 MG PO TABS: 10.0000 mg | ORAL_TABLET | ORAL | Status: DC | PRN

## 2019-10-16 MED ORDER — DIBUCAINE (PERIANAL) 1 % EX OINT: 1.0000 "application " | TOPICAL_OINTMENT | CUTANEOUS | Status: DC | PRN

## 2019-10-16 MED ORDER — IBUPROFEN 600 MG PO TABS
600.0000 mg | ORAL_TABLET | Freq: Four times a day (QID) | ORAL | Status: DC
  Administered 2019-10-16 – 2019-10-17 (×5): 600 mg via ORAL
  Filled 2019-10-16 (×5): qty 1

## 2019-10-16 MED ORDER — OXYCODONE-ACETAMINOPHEN 5-325 MG PO TABS: 1.0000 | ORAL_TABLET | ORAL | Status: DC | PRN

## 2019-10-16 MED ORDER — SODIUM CHLORIDE 0.9 % IV SOLN: 250.0000 mL | INTRAVENOUS | Status: DC | PRN

## 2019-10-16 MED ORDER — ONDANSETRON HCL 4 MG PO TABS: 4.0000 mg | ORAL_TABLET | ORAL | Status: DC | PRN

## 2019-10-16 SURGICAL SUPPLY — 25 items
ADHESIVE MASTISOL STRL (MISCELLANEOUS) ×3 IMPLANT
BAG COUNTER SPONGE EZ (MISCELLANEOUS) ×2 IMPLANT
CANISTER SUCT 3000ML PPV (MISCELLANEOUS) ×3 IMPLANT
CHLORAPREP W/TINT 26 (MISCELLANEOUS) ×6 IMPLANT
COUNTER SPONGE BAG EZ (MISCELLANEOUS) ×1
COVER WAND RF STERILE (DRAPES) ×3 IMPLANT
DRSG TELFA 3X8 NADH (GAUZE/BANDAGES/DRESSINGS) ×3 IMPLANT
GAUZE SPONGE 4X4 12PLY STRL (GAUZE/BANDAGES/DRESSINGS) ×3 IMPLANT
GLOVE BIOGEL PI ORTHO PRO 7.5 (GLOVE) ×2
GLOVE PI ORTHO PRO STRL 7.5 (GLOVE) ×1 IMPLANT
GOWN STRL REUS W/ TWL LRG LVL3 (GOWN DISPOSABLE) ×2 IMPLANT
GOWN STRL REUS W/TWL LRG LVL3 (GOWN DISPOSABLE) ×4
KIT TURNOVER KIT A (KITS) ×3 IMPLANT
NS IRRIG 1000ML POUR BTL (IV SOLUTION) ×3 IMPLANT
PACK C SECTION AR (MISCELLANEOUS) ×3 IMPLANT
PAD OB MATERNITY 4.3X12.25 (PERSONAL CARE ITEMS) ×3 IMPLANT
PAD PREP 24X41 OB/GYN DISP (PERSONAL CARE ITEMS) ×3 IMPLANT
PENCIL SMOKE ULTRAEVAC 22 CON (MISCELLANEOUS) ×3 IMPLANT
RETRACTOR WND ALEXIS-O 25 LRG (MISCELLANEOUS) ×1 IMPLANT
RTRCTR WOUND ALEXIS O 25CM LRG (MISCELLANEOUS) ×3
SPONGE LAP 18X18 RF (DISPOSABLE) ×3 IMPLANT
SUT VIC AB 0 CTX 36 (SUTURE) ×4
SUT VIC AB 0 CTX36XBRD ANBCTRL (SUTURE) ×2 IMPLANT
SUT VIC AB 1 CT1 36 (SUTURE) ×6 IMPLANT
SUT VICRYL+ 3-0 36IN CT-1 (SUTURE) ×6 IMPLANT

## 2019-10-16 NOTE — Progress Notes (Signed)
Wanda Sanchez is a 28 y.o. G2P1001 at [redacted]w[redacted]d by LMP admitted for induction of labor due to Post dates.  Subjective: States contractions are becoming more painful since water broke, but breathing through them  Objective: BP 101/69   Pulse 98   Temp 98.1 F (36.7 C) (Oral)   Resp 16   Ht 5\' 8"  (1.727 m)   Wt 77.1 kg   LMP 12/30/2018 (Approximate)   BMI 25.85 kg/m  I/O last 3 completed shifts: In: 72.5 [I.V.:72.5] Out: -  No intake/output data recorded.  FHT:  FHR: 125 bpm, variability: moderate,  accelerations:  Present,  decelerations:  Present variable down to 70-90 for 50-60 seconds with contractions and some after contractions, resolved with side-lying position. FSE placed for better assessment UC:   regular, every 2-3 minutes SVE:   Dilation: 4 Effacement (%): 80 Station: -2 Exam by:: Felisa Bonier CNM, ARom w/ large amount clear fluid, IUPC & FSE placed   Labs: Lab Results  Component Value Date   WBC 7.6 10/16/2019   HGB 11.7 (L) 10/16/2019   HCT 36.6 10/16/2019   MCV 88.8 10/16/2019   PLT 219 10/16/2019    Assessment / Plan: fetal intolerance to labor induction  Labor: Progressing normally Preeclampsia:  labs stable Fetal Wellbeing:  Category II Pain Control:  Labor support without medications I/D:  n/a Anticipated MOD:  possible cesarean delivery, Dr Amalia Hailey updated on FHR tracing and has reviewed the strip. agrees with IVF bolus and hands to knees position.  Melody N Shambley 10/16/2019, 8:59 AM

## 2019-10-16 NOTE — Progress Notes (Signed)
RN arrived at bedside @ [phone] to readjust pt monitors when noted that fetal heart rate was down in 70s. Several position changes were made, IV fluid bolus was started with no success. Emergency call light was pressed. Uterus was noted to be hard and not relax. Attending MD and CNM were notified and called to bedside. Dr. Gilman Schmidt was already in department and came to bedside to assess. Verbal order for terbutaline was given. Cervical exam proved no further change from previous exam. Fetal heartrate was finally able to come back to baseline in hands and knees position at 125 with moderate variability and 15x15 accelerations. CNM arrived at bedside @ [phone] to assess pt and review fetal strip. See flowsheets for exact intervention times.

## 2019-10-16 NOTE — H&P (Signed)
History and Physical   HPI  Wanda Sanchez is a 28 y.o. G2P1001 at [redacted]w[redacted]d Estimated Date of Delivery: 10/10/19 who is being admitted for induction of labor.     OB History  OB History  Gravida Para Term Preterm AB Living  2 1 1  0 0 1  SAB TAB Ectopic Multiple Live Births  0 0 0 0 1    # Outcome Date GA Lbr Len/2nd Weight Sex Delivery Anes PTL Lv  2 Current           1 Term 2017 [redacted]w[redacted]d  3371 g F Vag-Spont  N LIV    PROBLEM LIST  Pregnancy complications or risks: Patient Active Problem List   Diagnosis Date Noted  . Encounter for planned induction of labor 10/16/2019  . Anemia in pregnancy 07/25/2019  . Pyelectasis of fetus on prenatal ultrasound 06/24/2019  . Depression 09/03/2018    Prenatal labs and studies: ABO, Rh: --/--/PENDING (10/28 2440) Antibody: PENDING (10/28 0511) Rubella: 5.68 (03/19 1029) RPR: Non Reactive (08/05 0914)  HBsAg: Negative (03/19 1029)  HIV: Non Reactive (03/19 1029)  GBS:--/Negative (09/29 1612)   Past Medical History:  Diagnosis Date  . Medical history non-contributory      Past Surgical History:  Procedure Laterality Date  . NO PAST SURGERIES       Medications    Current Discharge Medication List    CONTINUE these medications which have NOT CHANGED   Details  ferrous sulfate 324 MG TBEC Take 324 mg by mouth daily before supper.    Prenatal Vit-Fe Fumarate-FA (PRENATAL MULTIVITAMIN) TABS tablet Take 1 tablet by mouth daily at 12 noon.         Allergies  Patient has no known allergies.  Review of Systems  Constitutional: negative Eyes: negative Ears, nose, mouth, throat, and face: negative Respiratory: negative Cardiovascular: negative Gastrointestinal: negative Genitourinary:negative Integument/breast: negative Hematologic/lymphatic: negative Musculoskeletal:negative Neurological: negative Behavioral/Psych: negative Endocrine: negative Allergic/Immunologic: negative  Physical Exam  BP 101/69    Pulse 98   Temp 97.8 F (36.6 C) (Oral)   Resp 16   Ht 5\' 8"  (1.727 m)   Wt 77.1 kg   LMP 12/30/2018 (Approximate)   BMI 25.85 kg/m   Lungs:  CTA B Cardio: RRR  Abd: Soft, gravid, NT Presentation: cephalic EXT: No C/C/ 1+ Edema DTRs: 2+ B CERVIX: Dilation: 3 Effacement (%): 70 Station: -3 Exam by:: C. Schuman   See Prenatal records for more detailed PE.     FHR:  Baseline: 125 bpm, Variability: Good {> 6 bpm), Accelerations: Reactive and Decelerations: prolonged decel to 80's x 12 min after cytotec placement.  Maternal position change, IV bolus, and terbutaline administered. FHR returned to 130 baseline , moderate variability and accels after deceleration and resolution of tachysystole.   Toco: Uterine Contractions: Episode of tachysystole after Cytotec placement with contractions q 1-2 min. During decel. Contractions after terbutaline q 3 min.     Test Results  Results for orders placed or performed during the hospital encounter of 10/16/19 (from the past 24 hour(s))  CBC     Status: Abnormal   Collection Time: 10/16/19  5:11 AM  Result Value Ref Range   WBC 7.6 4.0 - 10.5 K/uL   RBC 4.12 3.87 - 5.11 MIL/uL   Hemoglobin 11.7 (L) 12.0 - 15.0 g/dL   HCT 36.6 36.0 - 46.0 %   MCV 88.8 80.0 - 100.0 fL   MCH 28.4 26.0 - 34.0 pg   MCHC 32.0  30.0 - 36.0 g/dL   RDW 17.6 (H) 16.0 - 73.7 %   Platelets 219 150 - 400 K/uL   nRBC 0.0 0.0 - 0.2 %  Type and screen     Status: None (Preliminary result)   Collection Time: 10/16/19  5:11 AM  Result Value Ref Range   ABO/RH(D) PENDING    Antibody Screen PENDING    Sample Expiration      10/19/2019,2359 Performed at [facility], [address], [place], [place] [address]    Group B Strep negative  Assessment   G2P1001 at [redacted]w[redacted]d Estimated Date of Delivery: 10/10/19  The fetus is reassuring.    Patient Active Problem List   Diagnosis Date Noted  . Encounter for planned induction of labor 10/16/2019  .  Anemia in pregnancy 07/25/2019  . Pyelectasis of fetus on prenatal ultrasound 06/24/2019  . Depression 09/03/2018    Plan  1. Admit to L&D :   continue present management, discussed use of pitocin for labor.  2. EFM:-- Category 1 3. Epidural if desired.  Stadol for IV pain until epidural requested. 4. Admission labs  5. Dr. Valentino Saxon consulted 6.Anticipate NSVD  Doreene Burke, CNM  10/16/2019 6:57 AM

## 2019-10-17 LAB — CBC
HCT: 36.4 % (ref 36.0–46.0)
Hemoglobin: 11.4 g/dL — ABNORMAL LOW (ref 12.0–15.0)
MCH: 27.9 pg (ref 26.0–34.0)
MCHC: 31.3 g/dL (ref 30.0–36.0)
MCV: 89 fL (ref 80.0–100.0)
Platelets: 194 10*3/uL (ref 150–400)
RBC: 4.09 MIL/uL (ref 3.87–5.11)
RDW: 16.4 % — ABNORMAL HIGH (ref 11.5–15.5)
WBC: 10.1 10*3/uL (ref 4.0–10.5)
nRBC: 0 % (ref 0.0–0.2)

## 2019-10-17 MED ORDER — NORETHIN ACE-ETH ESTRAD-FE 1-20 MG-MCG PO TABS: 1.0000 | ORAL_TABLET | Freq: Every day | ORAL | 11 refills | Status: DC

## 2019-10-17 MED ORDER — IBUPROFEN 600 MG PO TABS: 600.0000 mg | ORAL_TABLET | Freq: Four times a day (QID) | ORAL | 0 refills | Status: DC

## 2019-10-17 NOTE — Discharge Summary (Signed)
Physician Obstetric Discharge Summary  Patient ID: Wanda Sanchez MRN: [id number] DOB/AGE: Jul 24, 1991 28 y.o.   Date of Admission: 10/16/2019  Date of Discharge:   Admitting Diagnosis: Induction of labor at [redacted]w[redacted]d  Secondary Diagnosis: VAVD  Mode of Delivery: vacuum-assisted vaginal delivery     Discharge Diagnosis: VAVD after cytotec IOL   Intrapartum Procedures: Atificial rupture of membranes, placement of fetal scalp electrode and placement of intrauterine catheter   Post partum procedures: none  Complications: none   Brief Hospital Course  Wanda Sanchez is a [id number] who had a SVD on 10/16/2019;  for further details of this, please refer to the delivey note.  Patient had an uncomplicated postpartum course.  By time of discharge on PPD#1, her pain was controlled on oral pain medications; she had appropriate lochia and was ambulating, voiding without difficulty and tolerating regular diet.  She was deemed stable for discharge to home.    Labs: CBC Latest Ref Rng & Units 10/17/2019 10/16/2019 09/17/2019  WBC 4.0 - 10.5 K/uL 10.1 7.6 7.8  Hemoglobin 12.0 - 15.0 g/dL 11.4(L) 11.7(L) 11.2  Hematocrit 36.0 - 46.0 % 36.4 36.6 33.3(L)  Platelets 150 - 400 K/uL 194 219 219   A POS  Physical exam:  Blood pressure 109/79, pulse 76, temperature 98.2 F (36.8 C), temperature source Oral, resp. rate 20, height 5\' 8"  (1.727 m), weight 77.1 kg, last menstrual period 12/30/2018, SpO2 98 %, unknown if currently breastfeeding. General: alert and no distress Lochia: appropriate Abdomen: soft, NT Uterine Fundus: firm Extremities: No evidence of DVT seen on physical exam. No lower extremity edema.  Discharge Instructions: Per After Visit Summary. Activity: Advance as tolerated. Pelvic rest for 6 weeks.  Also refer to After Visit Summary Diet: Regular Medications: Allergies as of 10/17/2019   No Known Allergies     Medication List    STOP taking these medications   ferrous sulfate  324 MG Tbec   prenatal multivitamin Tabs tablet     TAKE these medications   ibuprofen 600 MG tablet Commonly known as: ADVIL Take 1 tablet (600 mg total) by mouth every 6 (six) hours.   norethindrone-ethinyl estradiol 1-20 MG-MCG tablet Commonly known as: LOESTRIN FE Take 1 tablet by mouth daily.      Outpatient follow up:  Postpartum contraception: oral contraceptives (estrogen/progesterone)  Discharged Condition: good  Discharged to: home   Newborn Data: Disposition:home with mother  Apgars: APGAR (1 MIN): 8   APGAR (5 MINS): 9   APGAR (10 MINS):    Baby Feeding: Bottle   Rockney Ghee, CNM

## 2019-10-17 NOTE — Lactation Note (Signed)
This note was copied from a baby's chart. Lactation Consultation Note  Patient Name: Wanda Sanchez [id number] Date: 10/17/2019   [employer] intern visited parents to talk about formula feeding.  Upon questioning, parents said they plan on feeding formula as they did with their first.  They have used powdered formula in the past and plan on purchasing some to use upon discharge.  [facility] intern informed parents about the new guidelines for powdered formula preparation, encouraging them to batch make a days worth at a time.  The were told that a bottle is good for an hour after pulling it and to offer 30mL at each feed for the second day of life.  Parents were shown bottle paced feeding to allow baby to ingest as needed and not be overfed.  [facility] intern encouraged the parents to not force a baby to empty a bottle and to instead offer, pace, and burp to see if baby is still cuing.  Both parents are very receptive and are excited to go home to their other child.   Interventions    Lactation Tools Discussed/Used     Consult Status      Lavonia Drafts 10/17/2019, 12:01 PM

## 2019-10-17 NOTE — Progress Notes (Signed)
Patient discharged home with infant. Discharge instructions and prescriptions given and reviewed with patient. Patient verbalized understanding. Escorted out by staff.  

## 2019-10-17 NOTE — Progress Notes (Signed)
Post Partum Day 1 Subjective: no complaints, up ad lib and voiding  Objective: Blood pressure 109/79, pulse 76, temperature 98.2 F (36.8 C), temperature source Oral, resp. rate 20, height 5\' 8"  (1.727 m), weight 77.1 kg, last menstrual period 12/30/2018, SpO2 98 %, unknown if currently breastfeeding.  Physical Exam:  General: alert, cooperative and appears stated age Lochia: appropriate Uterine Fundus: firm Incision: NA DVT Evaluation: No evidence of DVT seen on physical exam. Negative Homan's sign.  Recent Labs    10/16/19 0511 10/17/19 0552  HGB 11.7* 11.4*  HCT 36.6 36.4    Assessment/Plan: Discharge home and Circumcision prior to discharge Infant feeding  Bottle    LOS: 1 day   Wanda Sanchez N Wanda Sanchez 10/17/2019, 9:01 AM

## 2019-10-30 ENCOUNTER — Telehealth: Payer: Self-pay

## 2019-10-30 ENCOUNTER — Telehealth: Payer: Self-pay | Admitting: Certified Nurse Midwife

## 2019-10-30 DIAGNOSIS — Z0289 Encounter for other administrative examinations: Secondary | ICD-10-CM

## 2019-10-30 NOTE — Telephone Encounter (Signed)
Called and lvm to notify pt our office has received her FLMA paperwork via fax, and a $25.00 form fee is required to be paid first prior to starting FMLA paperwork. Thank you.

## 2019-10-30 NOTE — Telephone Encounter (Signed)
Pt gave [employer] forms to be completed by Dr. Marcelline Mates. First day of leave 10/16/19 & Day to return to work 01/15/20. Pt request mail forms to her. Confirmed pt address. Pt request ph call to confirm date mailed to her. Phone number confirmed. Fee paid. Placed forms in providers box.

## 2019-11-04 ENCOUNTER — Ambulatory Visit (INDEPENDENT_AMBULATORY_CARE_PROVIDER_SITE_OTHER): Payer: BC Managed Care – PPO | Admitting: Certified Nurse Midwife

## 2019-11-04 ENCOUNTER — Other Ambulatory Visit: Payer: Self-pay

## 2019-11-04 ENCOUNTER — Encounter: Payer: Self-pay | Admitting: Certified Nurse Midwife

## 2019-11-04 VITALS — BP 100/65 | HR 72 | Ht 68.0 in | Wt 170.0 lb

## 2019-11-04 DIAGNOSIS — Z1331 Encounter for screening for depression: Secondary | ICD-10-CM | POA: Diagnosis not present

## 2019-11-04 NOTE — Progress Notes (Signed)
I have reviewed record and concur with patient management and plan of care.    Diona Fanti, CNM [facility], [facility]

## 2019-11-04 NOTE — Progress Notes (Signed)
Patient with no complaints.  Circumcision doing well.  Patient able to sleep, baby sleeping well.  Bottle feeding going well "he's eating like a champ".  Taking OCP.  PPV scheduled already.    Depression screen [facility] 2/9 11/04/2019 09/02/2019 07/24/2019 10/08/2018 09/03/2018  Decreased Interest 0 0 0 1 0  Down, Depressed, Hopeless 0 0 0 0 0  PHQ - 2 Score 0 0 0 1 0  Altered sleeping 0 - 0 0 0  Tired, decreased energy 0 - 0 0 1  Change in appetite 0 - 0 0 0  Feeling bad or failure about yourself  0 - 0 0 0  Trouble concentrating 0 - 0 0 0  Moving slowly or fidgety/restless 0 - 0 0 0  Suicidal thoughts 0 - 0 0 0  PHQ-9 Score 0 - 0 1 1  Difficult doing work/chores - - - Not difficult at all Not difficult at all   Edinburgh Postnatal Depression Scale - 11/04/19 1121      Edinburgh Postnatal Depression Scale:  In the Past 7 Days   I have been able to laugh and see the funny side of things.  0    I have looked forward with enjoyment to things.  0    I have blamed myself unnecessarily when things went wrong.  0    I have been anxious or worried for no good reason.  0    I have felt scared or panicky for no good reason.  0    Things have been getting on top of me.  0    I have been so unhappy that I have had difficulty sleeping.  0    I have felt sad or miserable.  0    I have been so unhappy that I have been crying.  0    The thought of harming myself has occurred to me.  0    Edinburgh Postnatal Depression Scale Total  0      GAD 7 : Generalized Anxiety Score 11/04/2019  Nervous, Anxious, on Edge 0  Control/stop worrying 0  Worry too much - different things 0  Trouble relaxing 0  Restless 0  Easily annoyed or irritable 0  Afraid - awful might happen 0  Total GAD 7 Score 0

## 2019-11-07 ENCOUNTER — Other Ambulatory Visit: Payer: Self-pay

## 2019-11-07 DIAGNOSIS — Z20822 Contact with and (suspected) exposure to covid-19: Secondary | ICD-10-CM

## 2019-11-10 LAB — NOVEL CORONAVIRUS, NAA: SARS-CoV-2, NAA: NOT DETECTED

## 2019-12-02 ENCOUNTER — Encounter: Payer: Self-pay | Admitting: Certified Nurse Midwife

## 2019-12-02 ENCOUNTER — Ambulatory Visit (INDEPENDENT_AMBULATORY_CARE_PROVIDER_SITE_OTHER): Payer: BC Managed Care – PPO | Admitting: Certified Nurse Midwife

## 2019-12-02 ENCOUNTER — Other Ambulatory Visit: Payer: Self-pay

## 2019-12-02 DIAGNOSIS — Z0289 Encounter for other administrative examinations: Secondary | ICD-10-CM

## 2019-12-02 MED ORDER — MEDROXYPROGESTERONE ACETATE 150 MG/ML IM SUSP: 150.0000 mg | INTRAMUSCULAR | 3 refills | Status: DC

## 2019-12-02 NOTE — Progress Notes (Signed)
    Subjective:    Wanda Sanchez is a 28 y.o. G85P2002 Caucasian female who presents for a postpartum visit. She is 6 weeks postpartum following a spontaneous vaginal delivery and vacuum, low at 40.6 gestational weeks. Anesthesia: none. I have fully reviewed the prenatal and intrapartum course. Postpartum course has been WNL. Baby's course has been WNL. Baby is feeding by bottle. Bleeding no bleeding. Bowel function is normal. Bladder function is normal. Patient is not sexually active.. Contraception method is OCP (estrogen/progesterone) but would like to switch to Depo injections. Postpartum depression screening: negative. Score 0.  Last pap 07/02/2018 and was negative.  The following portions of the patient's history were reviewed and updated as appropriate: allergies, current medications, past medical history, past surgical history and problem list.  Review of Systems Pertinent items are noted in HPI.   Vitals:   12/02/19 1001  BP: 105/67  Pulse: 82  Weight: 148 lb 1 oz (67.2 kg)  Height: 5\' 8"  (1.727 m)   No LMP recorded.  Objective:   General:  alert, cooperative and no distress   Breasts:  deferred, no complaints  Lungs: clear to auscultation bilaterally  Heart:  regular rate and rhythm  Abdomen: soft, nontender   Vulva: normal  Vagina: normal vagina  Cervix:  closed  Corpus: Well-involuted  Adnexa:  Non-palpable  Rectal Exam: no hemorrhoids        Assessment:   Postpartum exam 6 wks s/p VD with Vacuum assit Bottle feeding Depression screening Contraception counseling   Plan:  : Depo-Provera injections Follow up in: a few days for depo injection * or earlier if needed  Philip Aspen, CNM

## 2019-12-02 NOTE — Patient Instructions (Signed)
Hormonal Contraception Information Hormonal contraception is a type of birth control that uses hormones to prevent pregnancy. It usually involves a combination of the hormones estrogen and progesterone or only the hormone progesterone. Hormonal contraception works in these ways:  It thickens the mucus in the cervix, making it harder for sperm to enter the uterus.  It changes the lining of the uterus, making it harder for an egg to implant.  It may stop the ovaries from releasing eggs (ovulation). Some women who take hormonal contraceptives that contain only progesterone may continue to ovulate. Hormonal contraception cannot prevent sexually transmitted infections (STIs). Pregnancy may still occur. Estrogen and progesterone contraceptives Contraceptives that use a combination of estrogen and progesterone are available in these forms:  Pill. Pills come in different combinations of hormones. They must be taken at the same time each day. Pills can affect your period, causing you to get your period once every three months or not at all.  Patch. The patch must be worn on the lower abdomen for three weeks and then removed on the fourth.  Vaginal ring. The ring is placed in the vagina and left there for three weeks. It is then removed for one week. Progesterone contraceptives Contraceptives that use progesterone only are available in these forms:  Pill. Pills should be taken every day of the cycle.  Intrauterine device (IUD). This device is inserted into the uterus and removed or replaced every five years or sooner.  Implant. Plastic rods are placed under the skin of the upper arm. They are removed or replaced every three years or sooner.  Injection. The injection is given once every 90 days. What are the side effects? The side effects of estrogen and progesterone contraceptives include:  Nausea.  Headaches.  Breast tenderness.  Bleeding or spotting between menstrual cycles.  High blood  pressure (rare).  Strokes, heart attacks, or blood clots (rare) Side effects of progesterone-only contraceptives include:  Nausea.  Headaches.  Breast tenderness.  Unpredictable menstrual bleeding.  High blood pressure (rare). Talk to your health care provider about what side effects may affect you. Where to find more information  Ask your health care provider for more information and resources about hormonal contraception.  U.S. Department of Health and Human Services Office on Women's Health: www.womenshealth.gov Questions to ask:  What type of hormonal contraception is right for me?  How long should I plan to use hormonal contraception?  What are the side effects of the hormonal contraception method I choose?  How can I prevent STIs while using hormonal contraception? Contact a health care provider if:  You start taking hormonal contraceptives and you develop persistent or severe side effects. Summary  Estrogen and progesterone are hormones used in many forms of birth control.  Talk to your health care provider about what side effects may affect you.  Hormonal contraception cannot prevent sexually transmitted infections (STIs).  Ask your health care provider for more information and resources about hormonal contraception. This information is not intended to replace advice given to you by your health care provider. Make sure you discuss any questions you have with your health care provider. Document Released: 12/25/2007 Document Revised: 04/01/2019 Document Reviewed: 11/04/2016 Elsevier Patient Education  2020 Elsevier Inc.  

## 2019-12-06 ENCOUNTER — Other Ambulatory Visit: Payer: Self-pay

## 2019-12-06 ENCOUNTER — Ambulatory Visit (INDEPENDENT_AMBULATORY_CARE_PROVIDER_SITE_OTHER): Payer: BC Managed Care – PPO | Admitting: Certified Nurse Midwife

## 2019-12-06 VITALS — BP 111/73 | HR 90 | Ht 68.0 in | Wt 150.5 lb

## 2019-12-06 DIAGNOSIS — Z3042 Encounter for surveillance of injectable contraceptive: Secondary | ICD-10-CM

## 2019-12-06 DIAGNOSIS — Z3202 Encounter for pregnancy test, result negative: Secondary | ICD-10-CM | POA: Diagnosis not present

## 2019-12-06 LAB — POCT URINE PREGNANCY: Preg Test, Ur: NEGATIVE

## 2019-12-06 MED ORDER — MEDROXYPROGESTERONE ACETATE 150 MG/ML IM SUSP
150.0000 mg | Freq: Once | INTRAMUSCULAR | Status: AC
  Administered 2019-12-06: 14:00:00 150 mg via INTRAMUSCULAR

## 2019-12-06 NOTE — Progress Notes (Signed)
Date last pap:  Last Depo-Provera: first injection UPT-NEG Side Effects if any: n/a. Serum HCG indicated? n/a Depo-Provera 150 mg IM given by: FHampton,LPN. Next appointment due March 5-19, 2021.   BP 111/73   Pulse 90   Ht 5\' 8"  (1.727 m)   Wt 150 lb 8 oz (68.3 kg)   Breastfeeding No   BMI 22.88 kg/m

## 2019-12-06 NOTE — Addendum Note (Signed)
Addended by: Edwyna Shell on: 12/06/2019 03:16 PM   Modules accepted: Level of Service

## 2020-02-21 ENCOUNTER — Ambulatory Visit: Payer: BC Managed Care – PPO

## 2020-02-24 ENCOUNTER — Other Ambulatory Visit: Payer: Self-pay

## 2020-02-24 ENCOUNTER — Ambulatory Visit (INDEPENDENT_AMBULATORY_CARE_PROVIDER_SITE_OTHER): Payer: BC Managed Care – PPO | Admitting: Certified Nurse Midwife

## 2020-02-24 VITALS — BP 109/75 | HR 71 | Ht 68.0 in | Wt 151.3 lb

## 2020-02-24 DIAGNOSIS — Z3042 Encounter for surveillance of injectable contraceptive: Secondary | ICD-10-CM | POA: Diagnosis not present

## 2020-02-24 MED ORDER — MEDROXYPROGESTERONE ACETATE 150 MG/ML IM SUSP
150.0000 mg | Freq: Once | INTRAMUSCULAR | Status: AC
  Administered 2020-02-24: 08:00:00 150 mg via INTRAMUSCULAR

## 2020-02-24 NOTE — Progress Notes (Signed)
Date last pap: NA Last Depo-Provera:12/06/2019 Side Effects if any: NA  Serum HCG indicated? NA Depo-Provera 150 mg IM given by: [facility] CMA Next appointment due 05/11/2020-05/25/2020   Today's Vitals   02/24/20 0822  BP: 109/75  Pulse: 71  Weight: 151 lb 4.8 oz (68.6 kg)  Height: 5\' 8"  (1.727 m)   Body mass index is 23.01 kg/m.

## 2020-05-21 ENCOUNTER — Ambulatory Visit: Payer: BC Managed Care – PPO

## 2020-05-21 NOTE — Progress Notes (Signed)
Date last pap: due Last Depo-Provera: 02/24/2020. Side Effects if any: none. Serum HCG indicated? n/a. Depo-Provera 150 mg IM given by: FHampton, LPN. Next appointment due August 20-Sept 3, 2021.

## 2020-05-22 ENCOUNTER — Other Ambulatory Visit: Payer: Self-pay

## 2020-05-22 ENCOUNTER — Ambulatory Visit (INDEPENDENT_AMBULATORY_CARE_PROVIDER_SITE_OTHER): Payer: BC Managed Care – PPO

## 2020-05-22 DIAGNOSIS — Z3042 Encounter for surveillance of injectable contraceptive: Secondary | ICD-10-CM

## 2020-05-22 MED ORDER — MEDROXYPROGESTERONE ACETATE 150 MG/ML IM SUSP
150.0000 mg | Freq: Once | INTRAMUSCULAR | Status: AC
  Administered 2020-05-22: 150 mg via INTRAMUSCULAR

## 2020-07-20 ENCOUNTER — Ambulatory Visit (INDEPENDENT_AMBULATORY_CARE_PROVIDER_SITE_OTHER): Payer: BC Managed Care – PPO | Admitting: Certified Nurse Midwife

## 2020-07-20 ENCOUNTER — Encounter: Payer: Self-pay | Admitting: Certified Nurse Midwife

## 2020-07-20 VITALS — BP 101/60 | HR 69 | Ht 69.0 in | Wt 140.5 lb

## 2020-07-20 DIAGNOSIS — Z13220 Encounter for screening for lipoid disorders: Secondary | ICD-10-CM

## 2020-07-20 DIAGNOSIS — Z136 Encounter for screening for cardiovascular disorders: Secondary | ICD-10-CM

## 2020-07-20 DIAGNOSIS — Z01419 Encounter for gynecological examination (general) (routine) without abnormal findings: Secondary | ICD-10-CM | POA: Diagnosis not present

## 2020-07-20 DIAGNOSIS — Z113 Encounter for screening for infections with a predominantly sexual mode of transmission: Secondary | ICD-10-CM | POA: Diagnosis not present

## 2020-07-20 MED ORDER — MEDROXYPROGESTERONE ACETATE 150 MG/ML IM SUSP: 150.0000 mg | INTRAMUSCULAR | 3 refills | Status: DC

## 2020-07-20 NOTE — Progress Notes (Signed)
GYNECOLOGY ANNUAL PREVENTATIVE CARE ENCOUNTER NOTE  History:     Wanda Sanchez is a 29 y.o. G5P2002 female here for a routine annual gynecologic exam.  Current complaints: none.   Denies abnormal vaginal bleeding, discharge, pelvic pain, problems with intercourse or other gynecologic concerns.     Social Relationship: marroed Living: With spouse and 2 children Work: FT, [employer]  Exercise: 3 x wkly for 30 min -1 hr ( hot yoga/walking) Smoke/[place]/Drugs: occasional alcohol.   Gynecologic History No LMP recorded. Patient has had an injection. Contraception: Depo-Provera injections Last Pap: 07/02/2018 . Results were: normal  Last mammogram: n/a   Obstetric History OB History  Gravida Para Term Preterm AB Living  2 2 2     2   SAB TAB Ectopic Multiple Live Births        0 2    # Outcome Date GA Lbr Len/2nd Weight Sex Delivery Anes PTL Lv  2 Term 10/16/19 [redacted]w[redacted]d / 00:26 8 lb 9.6 oz (3.9 kg) M Vag-Vacuum None  LIV  1 Term 2017 [redacted]w[redacted]d  7 lb 6.9 oz (3.371 kg) F Vag-Spont  N LIV    No past medical history on file.  Past Surgical History:  Procedure Laterality Date  . NO PAST SURGERIES      Current Outpatient Medications on File Prior to Visit  Medication Sig Dispense Refill  . medroxyPROGESTERone (DEPO-PROVERA) 150 MG/ML injection Inject 1 mL (150 mg total) into the muscle every 3 (three) months. 1 mL 3   No current facility-administered medications on file prior to visit.    No Known Allergies  Social History:  reports that she has never smoked. She has never used smokeless tobacco. She reports previous alcohol use. She reports that she does not use drugs.  Family History  Problem Relation Age of Onset  . Breast cancer Maternal Aunt   . Breast cancer Paternal Aunt   . Cancer Maternal Grandfather   . Healthy Mother   . Healthy Father   . Ovarian cancer Neg Hx   . Colon cancer Neg Hx     The following portions of the patient's history  were reviewed and updated as appropriate: allergies, current medications, past family history, past medical history, past social history, past surgical history and problem list.  Review of Systems Pertinent items noted in HPI and remainder of comprehensive ROS otherwise negative.  Physical Exam:  There were no vitals taken for this visit. CONSTITUTIONAL: Well-developed, well-nourished female in no acute distress.  HENT:  Normocephalic, atraumatic, External right and left ear normal. Oropharynx is clear and moist EYES: Conjunctivae and EOM are normal. Pupils are equal, round, and reactive to light. No scleral icterus.  NECK: Normal range of motion, supple, no masses.  Normal thyroid.  SKIN: Skin is warm and dry. No rash noted. Not diaphoretic. No erythema. No pallor. MUSCULOSKELETAL: Normal range of motion. No tenderness.  No cyanosis, clubbing, or edema.  2+ distal pulses. NEUROLOGIC: Alert and oriented to person, place, and time. Normal reflexes, muscle tone coordination.  PSYCHIATRIC: Normal mood and affect. Normal behavior. Normal judgment and thought content. CARDIOVASCULAR: Normal heart rate noted, regular rhythm RESPIRATORY: Clear to auscultation bilaterally. Effort and breath sounds normal, no problems with respiration noted. BREASTS: Symmetric in size. No masses, tenderness, skin changes, nipple drainage, or lymphadenopathy bilaterally.  ABDOMEN: Soft, no distention noted.  No tenderness, rebound or guarding.  PELVIC: Normal appearing external genitalia and urethral meatus; normal appearing vaginal mucosa and cervix.  No abnormal discharge noted.  Pap smear not due  Normal uterine size, no other palpable masses, no uterine or adnexal tenderness.    Assessment and Plan:    1. Women's annual routine gynecological examination  Pap smear not due Mammogram not indicated LABS;Hep C, Lipid profile Refill: depo injection Referral: none Routine preventative health maintenance measures  emphasized. Please refer to After Visit Summary for other counseling recommendations.     Doreene Burke, CNM  [facility], [facility]

## 2020-07-20 NOTE — Patient Instructions (Signed)
Preventive Care 21-28 Years Old, Female Preventive care refers to visits with your health care provider and lifestyle choices that can promote health and wellness. This includes:  A yearly physical exam. This may also be called an annual well check.  Regular dental visits and eye exams.  Immunizations.  Screening for certain conditions.  Healthy lifestyle choices, such as eating a healthy diet, getting regular exercise, not using drugs or products that contain nicotine and tobacco, and limiting alcohol use. What can I expect for my preventive care visit? Physical exam Your health care provider will check your:  Height and weight. This may be used to calculate body mass index (BMI), which tells if you are at a healthy weight.  Heart rate and blood pressure.  Skin for abnormal spots. Counseling Your health care provider may ask you questions about your:  Alcohol, tobacco, and drug use.  Emotional well-being.  Home and relationship well-being.  Sexual activity.  Eating habits.  Work and work environment.  Method of birth control.  Menstrual cycle.  Pregnancy history. What immunizations do I need?  Influenza (flu) vaccine  This is recommended every year. Tetanus, diphtheria, and pertussis (Tdap) vaccine  You may need a Td booster every 10 years. Varicella (chickenpox) vaccine  You may need this if you have not been vaccinated. Human papillomavirus (HPV) vaccine  If recommended by your health care provider, you may need three doses over 6 months. Measles, mumps, and rubella (MMR) vaccine  You may need at least one dose of MMR. You may also need a second dose. Meningococcal conjugate (MenACWY) vaccine  One dose is recommended if you are age 19-21 years and a first-year college student living in a residence hall, or if you have one of several medical conditions. You may also need additional booster doses. Pneumococcal conjugate (PCV13) vaccine  You may need  this if you have certain conditions and were not previously vaccinated. Pneumococcal polysaccharide (PPSV23) vaccine  You may need one or two doses if you smoke cigarettes or if you have certain conditions. Hepatitis A vaccine  You may need this if you have certain conditions or if you travel or work in places where you may be exposed to hepatitis A. Hepatitis B vaccine  You may need this if you have certain conditions or if you travel or work in places where you may be exposed to hepatitis B. Haemophilus influenzae type b (Hib) vaccine  You may need this if you have certain conditions. You may receive vaccines as individual doses or as more than one vaccine together in one shot (combination vaccines). Talk with your health care provider about the risks and benefits of combination vaccines. What tests do I need?  Blood tests  Lipid and cholesterol levels. These may be checked every 5 years starting at age 20.  Hepatitis C test.  Hepatitis B test. Screening  Diabetes screening. This is done by checking your blood sugar (glucose) after you have not eaten for a while (fasting).  Sexually transmitted disease (STD) testing.  BRCA-related cancer screening. This may be done if you have a family history of breast, ovarian, tubal, or peritoneal cancers.  Pelvic exam and Pap test. This may be done every 3 years starting at age 21. Starting at age 30, this may be done every 5 years if you have a Pap test in combination with an HPV test. Talk with your health care provider about your test results, treatment options, and if necessary, the need for more tests.   Follow these instructions at home: Eating and drinking   Eat a diet that includes fresh fruits and vegetables, whole grains, lean protein, and low-fat dairy.  Take vitamin and mineral supplements as recommended by your health care provider.  Do not drink alcohol if: ? Your health care provider tells you not to drink. ? You are  pregnant, may be pregnant, or are planning to become pregnant.  If you drink alcohol: ? Limit how much you have to 0-1 drink a day. ? Be aware of how much alcohol is in your drink. In the [place] one drink equals one 12 oz bottle of beer (355 mL), one 5 oz glass of wine (148 mL), or one 1 oz glass of hard liquor (44 mL). Lifestyle  Take daily care of your teeth and gums.  Stay active. Exercise for at least 30 minutes on 5 or more days each week.  Do not use any products that contain nicotine or tobacco, such as cigarettes, e-cigarettes, and chewing tobacco. If you need help quitting, ask your health care provider.  If you are sexually active, practice safe sex. Use a condom or other form of birth control (contraception) in order to prevent pregnancy and STIs (sexually transmitted infections). If you plan to become pregnant, see your health care provider for a preconception visit. What's next?  Visit your health care provider once a year for a well check visit.  Ask your health care provider how often you should have your eyes and teeth checked.  Stay up to date on all vaccines. This information is not intended to replace advice given to you by your health care provider. Make sure you discuss any questions you have with your health care provider. Document Revised: 08/16/2018 Document Reviewed: 08/16/2018 Elsevier Patient Education  2020 [employer].

## 2020-07-21 LAB — LIPID PANEL
Chol/HDL Ratio: 2.7 ratio (ref 0.0–4.4)
Cholesterol, Total: 178 mg/dL (ref 100–199)
HDL: 65 mg/dL (ref >39)
LDL Chol Calc (NIH): 104 mg/dL — ABNORMAL HIGH (ref 0–99)
Triglycerides: 47 mg/dL (ref 0–149)
VLDL Cholesterol Cal: 9 mg/dL (ref 5–40)

## 2020-07-21 LAB — HEPATITIS C ANTIBODY: Hep C Virus Ab: <0.1 s/co ratio (ref 0.0–0.9)

## 2020-08-11 ENCOUNTER — Ambulatory Visit: Payer: BC Managed Care – PPO

## 2020-08-13 ENCOUNTER — Other Ambulatory Visit: Payer: Self-pay

## 2020-08-13 ENCOUNTER — Ambulatory Visit (INDEPENDENT_AMBULATORY_CARE_PROVIDER_SITE_OTHER): Payer: BC Managed Care – PPO

## 2020-08-13 DIAGNOSIS — Z3042 Encounter for surveillance of injectable contraceptive: Secondary | ICD-10-CM

## 2020-08-13 MED ORDER — MEDROXYPROGESTERONE ACETATE 150 MG/ML IM SUSP
150.0000 mg | Freq: Once | INTRAMUSCULAR | Status: AC
  Administered 2020-08-13: 150 mg via INTRAMUSCULAR

## 2020-08-13 NOTE — Progress Notes (Signed)
Date last pap: 07/02/2020 Last Annual Exam:07/20/2020 Last Depo-Provera: 05/22/2020 Side Effects if any: none Serum HCG indicated? n/a Depo-Provera 150 mg IM given by: Jari Pigg, LPN Next appointment due November 11-25, 2021.

## 2020-11-09 ENCOUNTER — Ambulatory Visit (INDEPENDENT_AMBULATORY_CARE_PROVIDER_SITE_OTHER): Payer: BC Managed Care – PPO | Admitting: Surgical

## 2020-11-09 ENCOUNTER — Other Ambulatory Visit: Payer: Self-pay

## 2020-11-09 DIAGNOSIS — Z3042 Encounter for surveillance of injectable contraceptive: Secondary | ICD-10-CM

## 2020-11-09 MED ORDER — MEDROXYPROGESTERONE ACETATE 150 MG/ML IM SUSP
150.0000 mg | Freq: Once | INTRAMUSCULAR | Status: AC
  Administered 2020-11-09: 150 mg via INTRAMUSCULAR

## 2020-11-09 NOTE — Progress Notes (Signed)
Date last pap:NA Last Depo-Provera: 08/13/20 Side Effects if any: NA Serum HCG indicated? NA Depo-Provera 150 mg IM given by: [facility] CMA  Next appointment due 01/25/26-02/09/20

## 2021-01-24 IMAGING — US US MFM OB COMP + 14 WK
1 series · 16 of 28 positions shown · non-contrast
Comparison: none

PATIENT INFO:

PERFORMED BY:
                   Sonographer
SERVICE(S) PROVIDED:
 ----------------------------------------------------------------------
INDICATIONS:
  30 weeks gestation of pregnancy
FETAL EVALUATION:
 Num Of Fetuses:         1
 Fetal Heart Rate(bpm):  131
 Presentation:           Cephalic
 Placenta:               Anterior
 P. Cord Insertion:      Normal
 AFI Sum(cm)     %Tile       Largest Pocket(cm)
 10.6            19
 RUQ(cm)       RLQ(cm)       LUQ(cm)        LLQ(cm)
 3.94          0
BIOMETRY:
 BPD:      76.9  mm     G. Age:  30w 6d         47  %    CI:        72.14   %    70 - 86
                                                         FL/HC:      20.8   %    19.3 -
 HC:      288.1  mm     G. Age:  31w 5d         46  %    HC/AC:      1.05        0.96 -
 AC:      275.5  mm     G. Age:  31w 5d         76  %    FL/BPD:     77.9   %    71 - 87
 FL:       59.9  mm     G. Age:  31w 1d         53  %    FL/AC:      21.7   %    20 - 24
 HUM:      54.6  mm     G. Age:  31w 5d         75  %
 Est. FW:    2973  gm    3 lb 14 oz      63  %
OB HISTORY:
 Gravidity:    2         Term:   1
GESTATIONAL AGE:
 LMP:           31w 1d        Date:  12/30/18                 EDD:   10/06/19
 U/S Today:     31w 3d                                        EDD:   10/04/19
 Best:          30w 4d     Det. By:  Early Ultrasound         EDD:   10/10/19
                                     (03/06/19)
ANATOMY:
 Cavum:                 CSP visualized         Aortic Arch:            Normal appearance
 Ventricles:            Normal appearance      Ductal Arch:            Normal appearance
 Choroid Plexus:        Within Normal Limits   Diaphragm:              Within Normal Limits
 Cerebellum:            Within Normal Limits   Stomach:                Seen
 Posterior Fossa:       Within Normal Limits   Abdomen:                Within Normal
                                                                       Limits
 Nuchal Fold:           Within Normal Limits   Abdominal Wall:         Cord insertion seen
 Face:                  Orbits visualized      Cord Vessels:           3 vessels
 Lips:                  Normal appearance      Kidneys:                Lt. WNL, Rt. Mild
                                                                       Pylectasis =  5.3mm
 Thoracic:              Within Normal Limits   Bladder:                Seen
 Heart:                 4-Chamber view         Spine:                  Normal appearance
                        appears normal
 RVOT:                  Normal appearance      Upper Extremities:      Visualized
 LVOT:                  Normal appearance      Lower Extremities:      Visualized
CERVIX UTERUS ADNEXA:
 Cervix
 Length:           4.14  cm.

[Series 1: us mfm ob comp + 14 wk · 0.22mm/px · 16 of 94 slices shown]
[im 1/94]
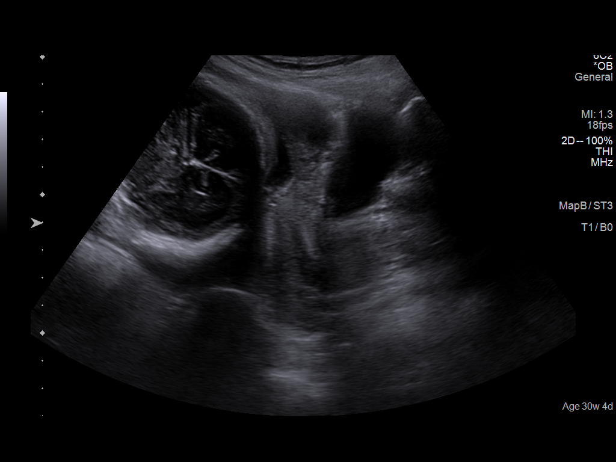
[im 7/94]
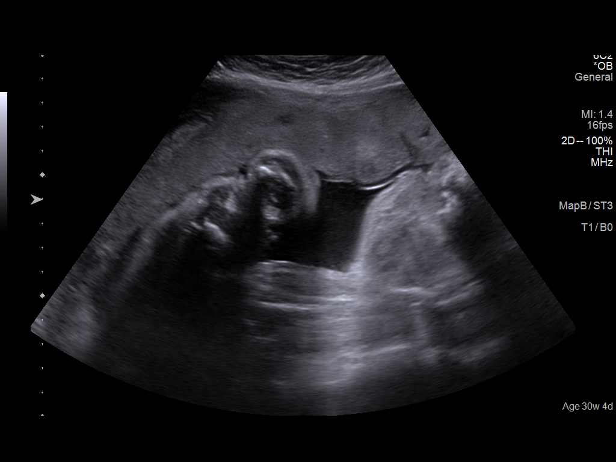
[im 14/94]
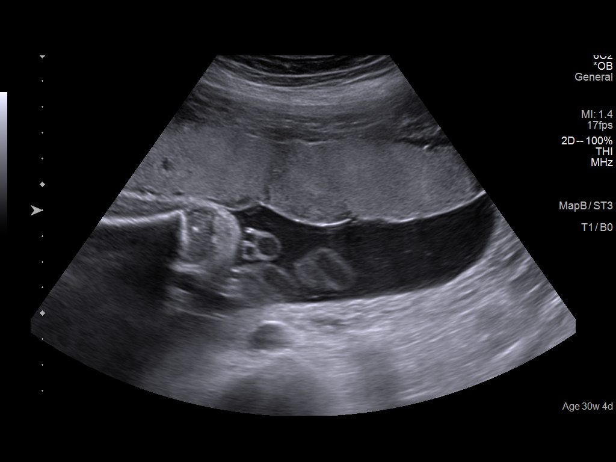
[im 21/94]
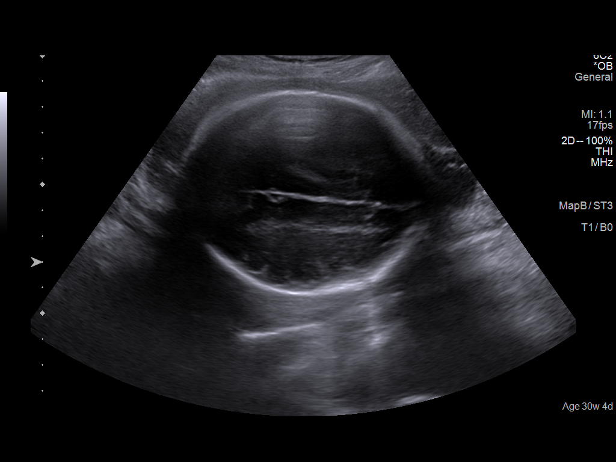
[im 25/94]
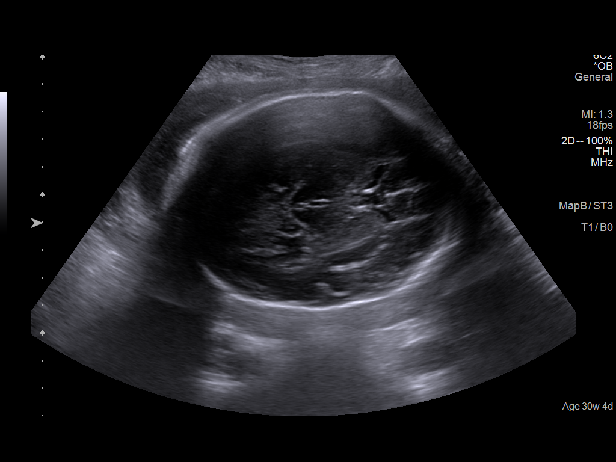
[im 32/94]
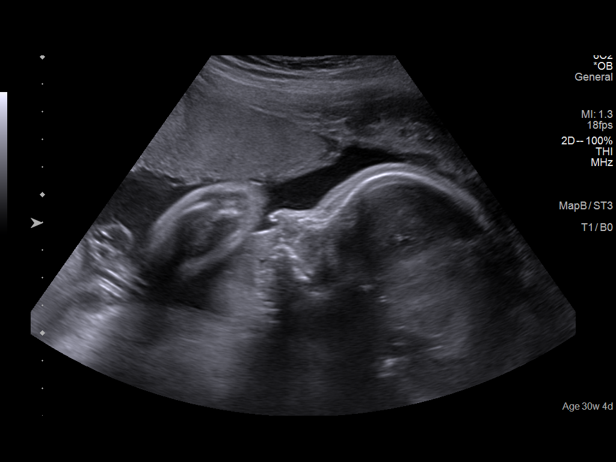
[im 38/94]
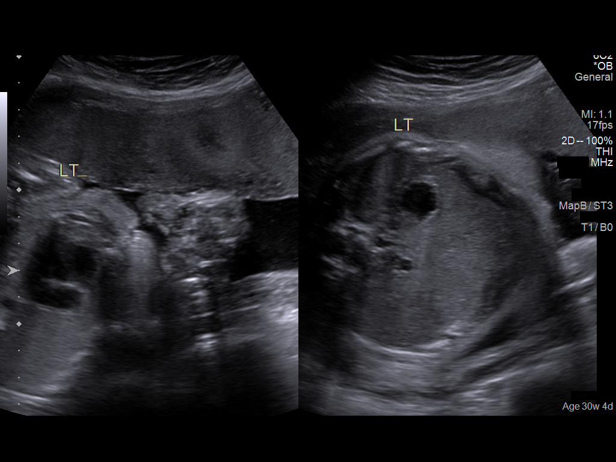
[im 45/94]
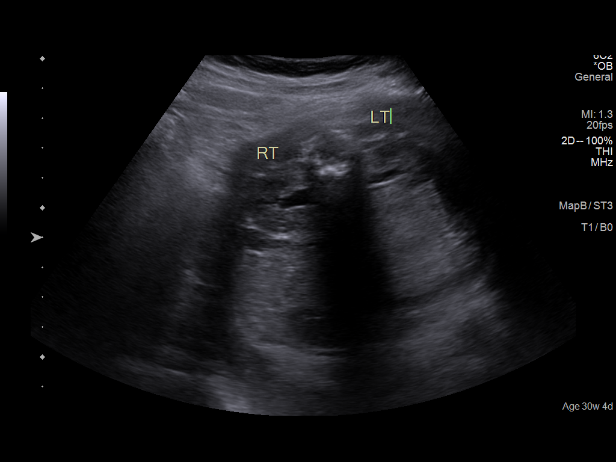
[im 49/94]
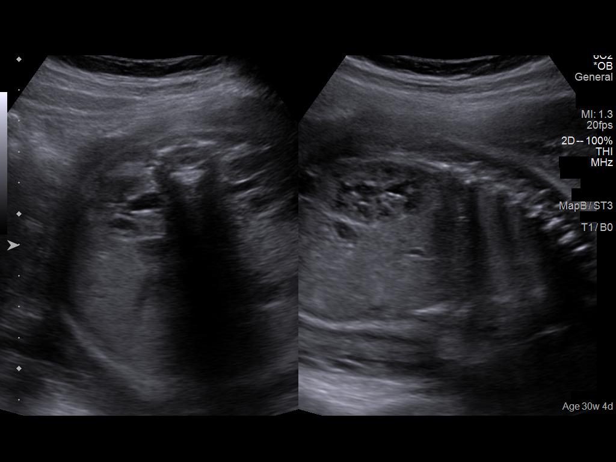
[im 56/94]
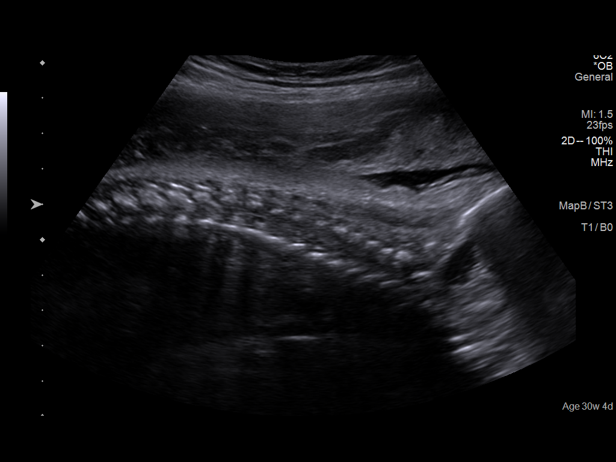
[im 63/94]
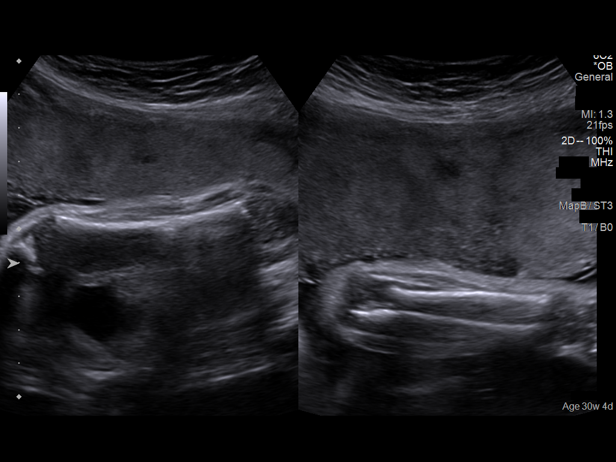
[im 69/94]
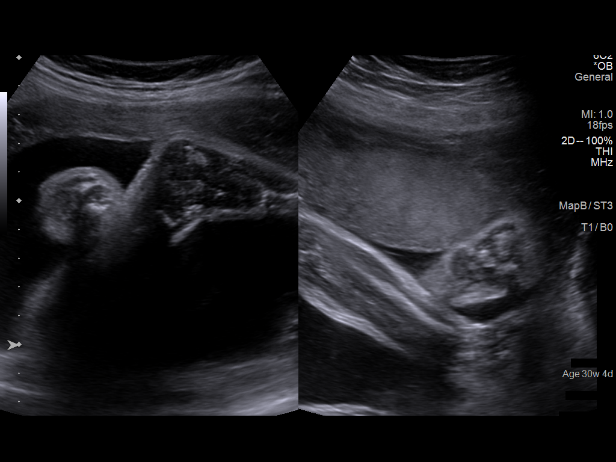
[im 73/94]
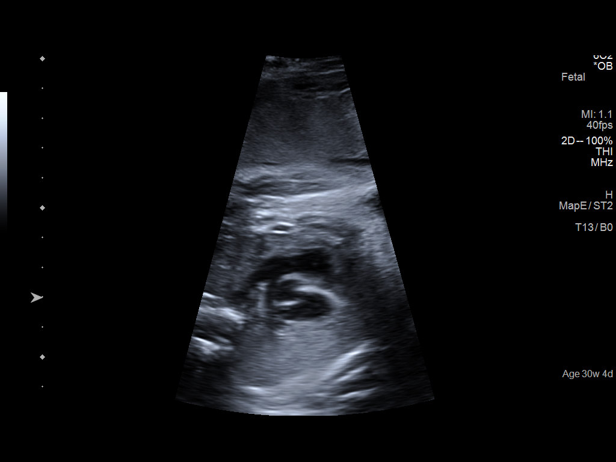
[im 80/94]
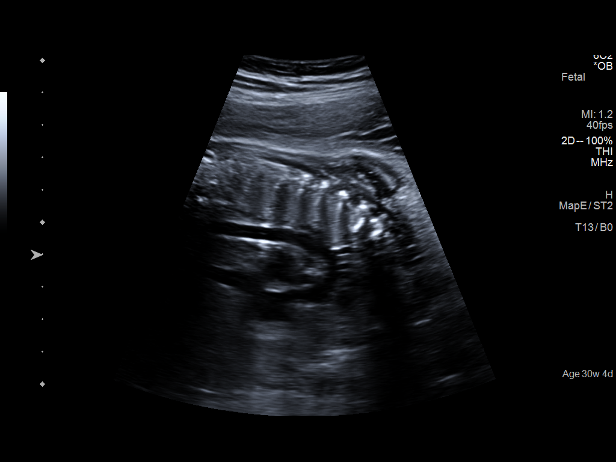
[im 87/94]
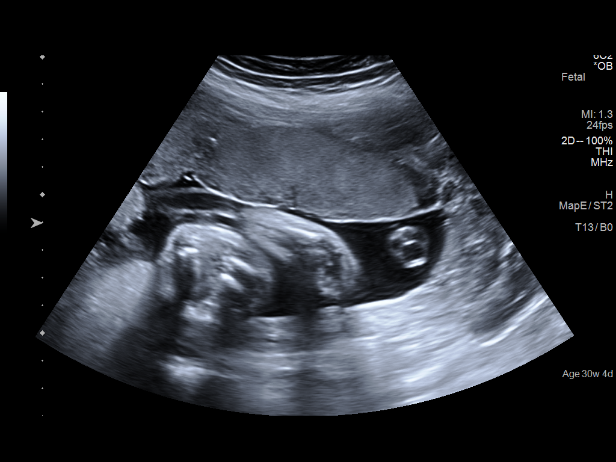
[im 94/94]
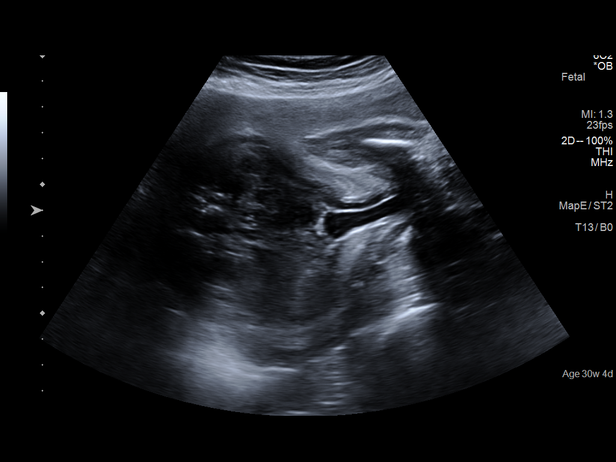

[16 of 28 positions shown; findings below may reference images not displayed]

IMPRESSION: Thank you for referring your patient for a detailed fetal
 anatomical survey due to fetal renal pelvic dilation.

 Ultraosund demonstrates  a single live pregnancy at 30
 weeks 4 days.   Dating is by LMP consistent with earliest
 available ultrasound performed at [facility] on 03/06/19;
 measurements were consistent with 8 weeks 6 dyas.

 The fetal biometry correlates with established dating.  The
 right renal pelvis is slightly dilated measuring 5.3mm.  No
 calyceal dilation or parenchymal involvement is seen.  The
 remainder of the anatomic survey is limited by advanced
 gestatoinal age. The abdominal cord insertion is not welll
 imaged. The remainder of the anatomic survey is
 unremarkable. The amniotic fluid volume is normal and active
 fetal movements are seen.

 It must be noted that a normal ultrasound cannot rule out
 aneuploidy.

 Findings were reviewed.

 Renal pyelectasis (at least one renal pelvis measuring 4mm
 or greater in the 2nd trimester) was noted on the study today.
 This is a relatively common finding, diagnosed in up to 3% of
 all pregnancies.  Although renal pelvic dilatation is a
 transient, physiologic state in most cases, urinary tract
 obstruction and vesicoureteral reflux can occasionally be
 causal.  It occurs approximately twice as often in males than
 in females, and is bilateral in 20-40% of cases.

 Mild pyelectasis is defined by a renal pelvic Anterior-Posterior
 diameter (APD) of 4-10mm, and will usually resolve without
 clinical impact on neonatal renal development.  There are
 however, persistent cases that require postnatal intervention
 (10%).  The risk increases to 70% when the APD is greater
 than 20mm.  It is important to note that isolated renal
 pyelectasis has not been found to have an impact on
 obstetric outcomes.

 Follow-up is suggested in 4 weeks to re-evaluate the
 kidneys(scheduled).

 Thank you for allowing us to participate in your patient's care.
 assistance.
                       Fabiansky, Kaharuddin Kahar

## 2021-01-25 ENCOUNTER — Ambulatory Visit: Payer: BC Managed Care – PPO

## 2021-01-26 ENCOUNTER — Telehealth: Payer: Self-pay

## 2021-01-26 NOTE — Telephone Encounter (Signed)
mychart message sent to patient change appointment from 01/28/21 at 245 to 01/27/21 at 245.

## 2021-01-28 ENCOUNTER — Other Ambulatory Visit: Payer: Self-pay

## 2021-01-28 ENCOUNTER — Ambulatory Visit: Payer: BC Managed Care – PPO

## 2021-01-28 ENCOUNTER — Ambulatory Visit (INDEPENDENT_AMBULATORY_CARE_PROVIDER_SITE_OTHER): Payer: BC Managed Care – PPO

## 2021-01-28 DIAGNOSIS — Z3042 Encounter for surveillance of injectable contraceptive: Secondary | ICD-10-CM

## 2021-01-28 MED ORDER — MEDROXYPROGESTERONE ACETATE 150 MG/ML IM SUSP
150.0000 mg | Freq: Once | INTRAMUSCULAR | Status: AC
Start: 2021-01-28 — End: 2021-01-28
  Administered 2021-01-28: 150 mg via INTRAMUSCULAR

## 2021-01-28 NOTE — Progress Notes (Signed)
Last depo inj: 11/09/20 UPT: N/A Side effects: none Next Depo- Provera injection due: 04/15/21-04/29/21 Annual exam due:07/20/21

## 2021-01-29 ENCOUNTER — Ambulatory Visit: Payer: BC Managed Care – PPO

## 2021-03-15 IMAGING — US OBSTETRIC <14 WK ULTRASOUND
1 series · 13 of 28 positions shown · non-contrast
Comparison: None.

CLINICAL DATA: Unsure dates

EXAM:
OBSTETRIC <14 WK US AND TRANSVAGINAL OB US
TECHNIQUE: Both transabdominal and transvaginal ultrasound examinations were
performed for complete evaluation of the gestation as well as the
maternal uterus, adnexal regions, and pelvic cul-de-sac.
Transvaginal technique was performed to assess early pregnancy.

[Series 1: obstetric <14 wk ultrasound · 0.07mm/px · 13 of 86 slices shown]
[im 4/86]
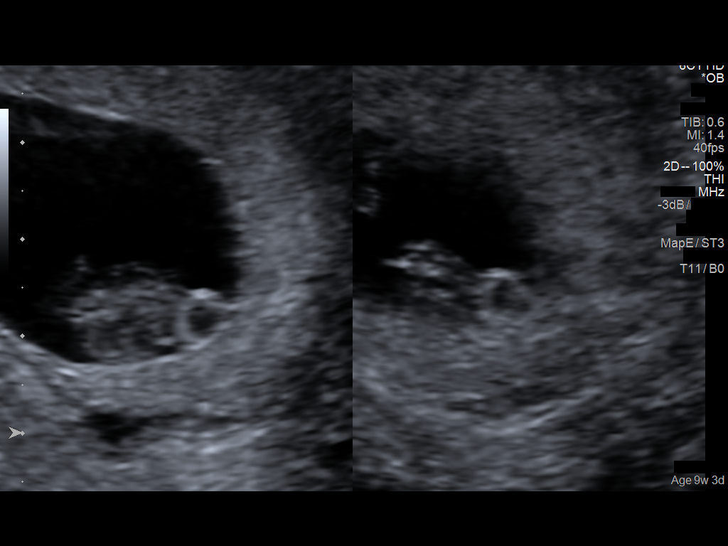
[im 10/86]
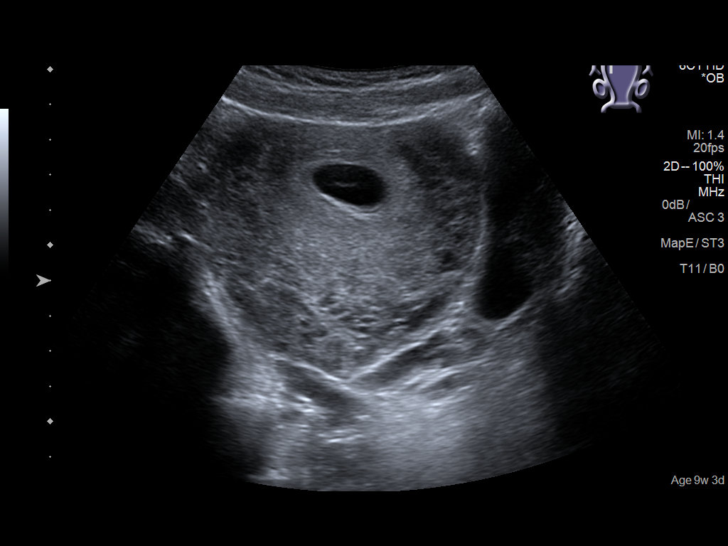
[im 16/86]
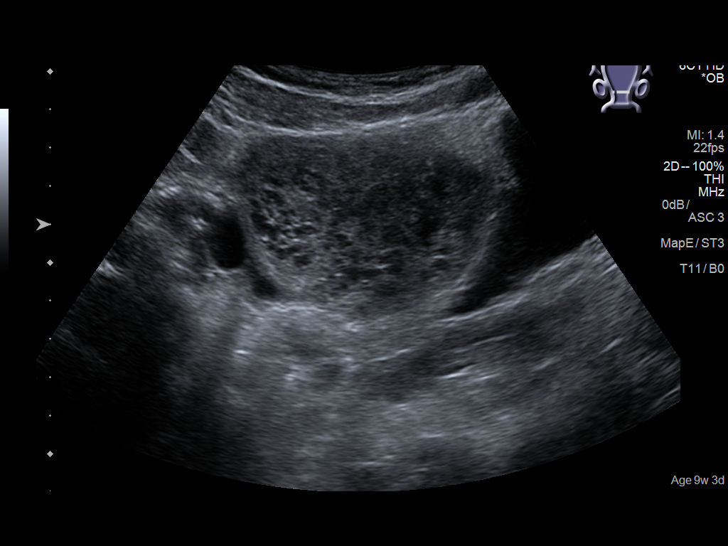
[im 23/86]
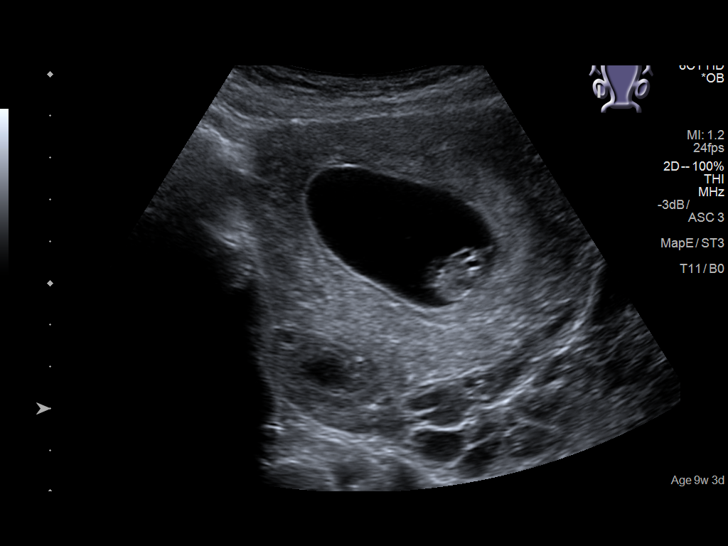
[im 29/86]
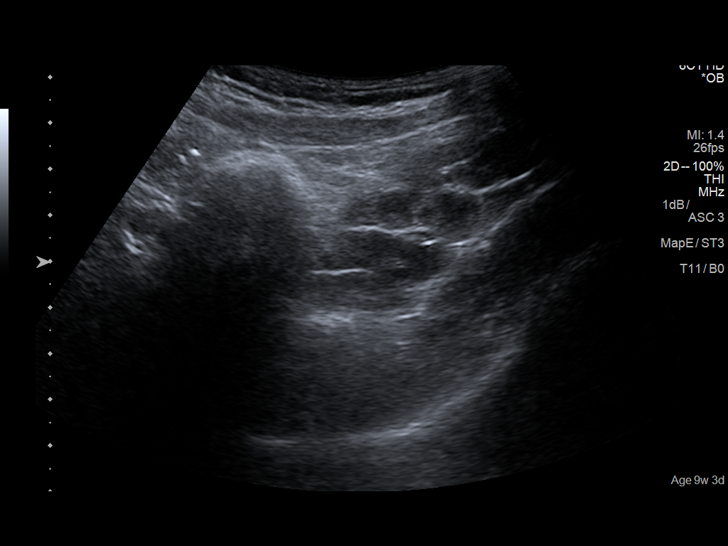
[im 35/86]
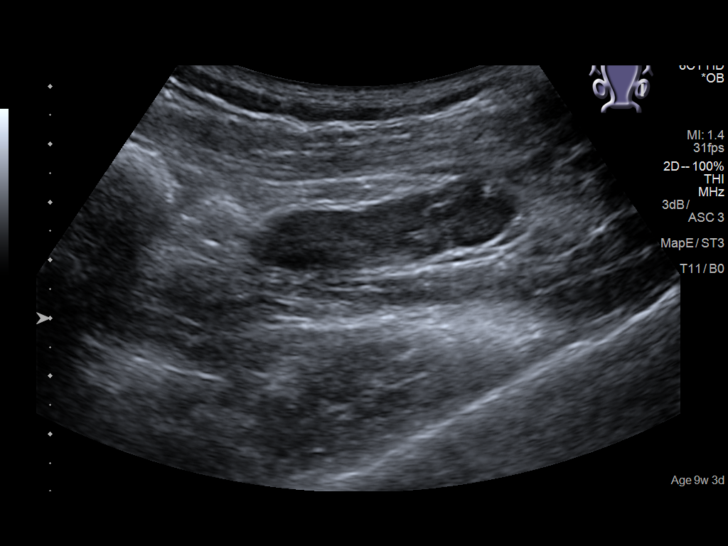
[im 45/86]
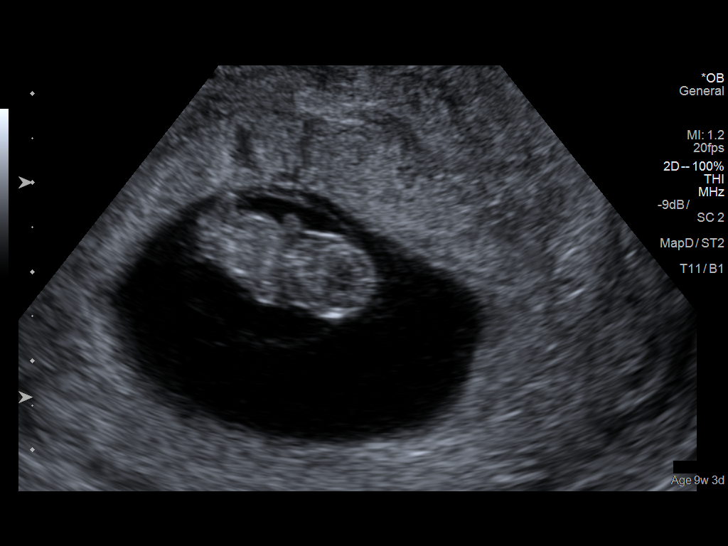
[im 51/86]
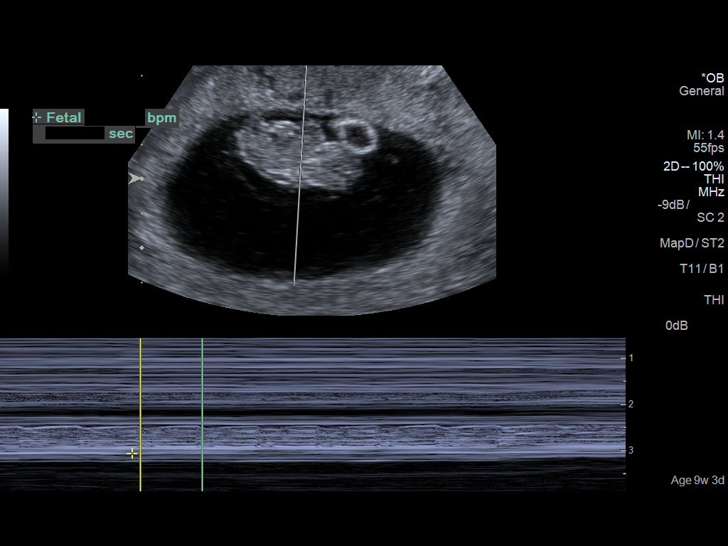
[im 57/86]
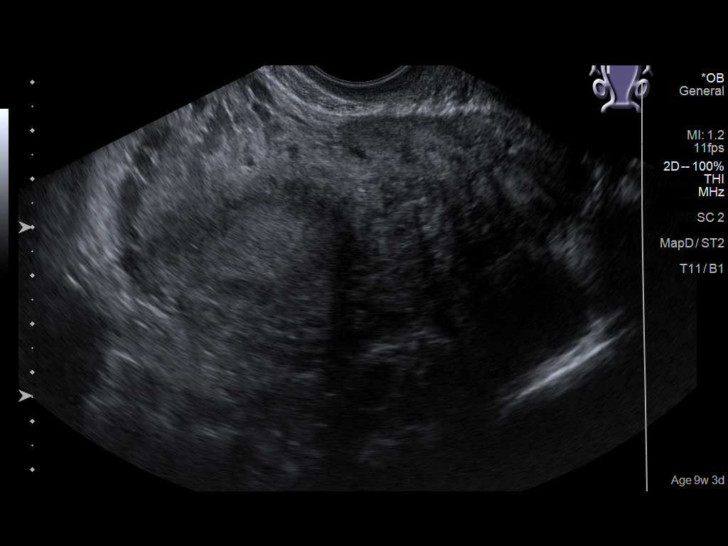
[im 63/86]
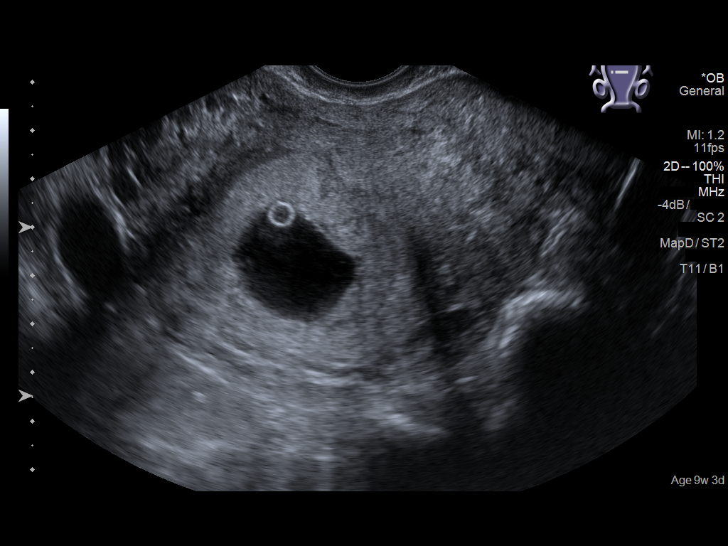
[im 70/86]
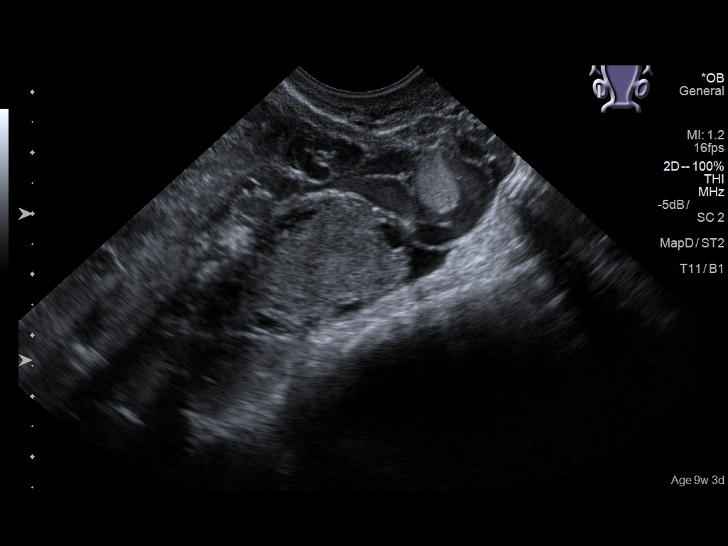
[im 76/86]
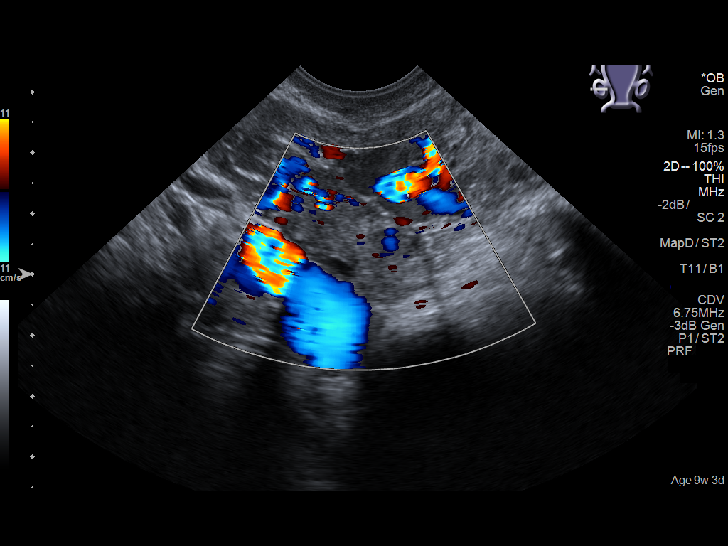
[im 82/86]
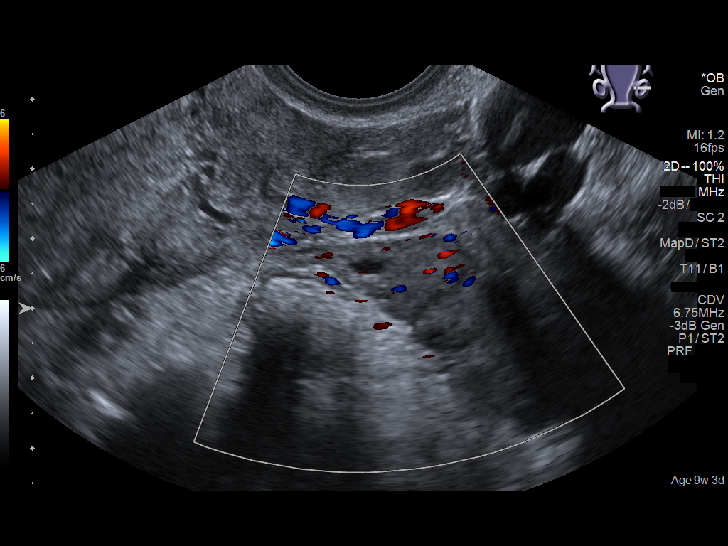

[13 of 28 positions shown; findings below may reference images not displayed]

FINDINGS: Intrauterine gestational sac: Single

Yolk sac:  Visualized.

Embryo:  Visualized.

Cardiac Activity: Visualized.

Heart Rate: 165 bpm

CRL: 21.1 mm   8 w   6 d                  US EDC: 10/10/2019

Subchorionic hemorrhage:  Small subchorionic hemorrhage inferiorly.

Maternal uterus/adnexae: Ovaries are within normal limits. Right
ovary measures 3 x 2 x 2.4 cm with volume of 7.3 mL. Left ovary
measures 2.8 x 2.1 x 3 cm with volume of 9.5 mL. Trace free fluid
IMPRESSION: 1. Single viable intrauterine pregnancy corresponding to estimated
age of 8 weeks 6 days and EDC of 10/10/2019
2. Small subchorionic hemorrhage
3. Trace free fluid

These results will be called to the ordering clinician or
representative by the Radiologist Assistant, and communication
documented in the PACS or zVision Dashboard.

## 2021-04-16 ENCOUNTER — Ambulatory Visit: Payer: BC Managed Care – PPO

## 2021-07-26 ENCOUNTER — Ambulatory Visit (INDEPENDENT_AMBULATORY_CARE_PROVIDER_SITE_OTHER): Payer: Self-pay | Admitting: Certified Nurse Midwife

## 2021-07-26 ENCOUNTER — Other Ambulatory Visit: Payer: Self-pay

## 2021-07-26 ENCOUNTER — Other Ambulatory Visit (HOSPITAL_COMMUNITY)
Admission: RE | Admit: 2021-07-26 | Discharge: 2021-07-26 | Disposition: A | Discharge status: Home / Self Care | Payer: BC Managed Care – PPO | Source: Ambulatory Visit | Attending: Certified Nurse Midwife | Admitting: Certified Nurse Midwife

## 2021-07-26 ENCOUNTER — Encounter: Payer: Self-pay | Admitting: Certified Nurse Midwife

## 2021-07-26 VITALS — BP 116/74 | HR 90 | Ht 68.0 in | Wt 142.1 lb

## 2021-07-26 DIAGNOSIS — Z01419 Encounter for gynecological examination (general) (routine) without abnormal findings: Secondary | ICD-10-CM | POA: Diagnosis not present

## 2021-07-26 DIAGNOSIS — Z124 Encounter for screening for malignant neoplasm of cervix: Secondary | ICD-10-CM | POA: Diagnosis present

## 2021-07-26 MED ORDER — MEDROXYPROGESTERONE ACETATE 150 MG/ML IM SUSP
150.0000 mg | INTRAMUSCULAR | 3 refills | Status: DC
Start: 2021-07-26 — End: 2022-08-02

## 2021-07-26 NOTE — Progress Notes (Signed)
GYNECOLOGY ANNUAL PREVENTATIVE CARE ENCOUNTER NOTE  History:     Wanda Sanchez is a 30 y.o. G63P2002 female here for a routine annual gynecologic exam.  Current complaints: none.   Denies abnormal vaginal bleeding, discharge, pelvic pain, problems with intercourse or other gynecologic concerns.     Social Relationship: married  Living: spouse and 2 children Work: Consulting civil engineer Exercise: 3x wk 30 min-1hr (walking/Hot yoga) Smoke/Alcohol/drug use: occ alcohol use, denies smoking and drug use.   Gynecologic History Patient's last menstrual period was 07/04/2021 (exact date). Contraception: Depo-Provera injections Last Pap: 07/02/2018. Results were: normal  Last mammogram: n/a .    The pregnancy intention screening data noted above was reviewed. Potential methods of contraception were discussed. The patient elected to proceed with Depo injection.  Obstetric History OB History  Gravida Para Term Preterm AB Living  2 2 2     2   SAB IAB Ectopic Multiple Live Births        0 2    # Outcome Date GA Lbr Len/2nd Weight Sex Delivery Anes PTL Lv  2 Term 10/16/19 [redacted]w[redacted]d / 00:26 8 lb 9.6 oz (3.9 kg) M Vag-Vacuum None  LIV  1 Term 2017 [redacted]w[redacted]d  7 lb 6.9 oz (3.371 kg) F Vag-Spont  N LIV    History reviewed. No pertinent past medical history.  Past Surgical History:  Procedure Laterality Date   NO PAST SURGERIES      Current Outpatient Medications on File Prior to Visit  Medication Sig Dispense Refill   medroxyPROGESTERone (DEPO-PROVERA) 150 MG/ML injection Inject 1 mL (150 mg total) into the muscle every 3 (three) months. 1 mL 3   No current facility-administered medications on file prior to visit.    No Known Allergies  Social History:  reports that she has never smoked. She has never used smokeless tobacco. She reports previous alcohol use. She reports that she does not use drugs.  Family History  Problem Relation Age of Onset   Breast cancer Maternal Aunt     Breast cancer Paternal Aunt    Cancer Maternal Grandfather    Healthy Mother    Healthy Father    Ovarian cancer Neg Hx    Colon cancer Neg Hx     The following portions of the patient's history were reviewed and updated as appropriate: allergies, current medications, past family history, past medical history, past social history, past surgical history and problem list.  Review of Systems Pertinent items noted in HPI and remainder of comprehensive ROS otherwise negative.  Physical Exam:  BP 116/74   Pulse 90   Ht 5\' 8"  (1.727 m)   Wt 142 lb 1.6 oz (64.5 kg)   LMP 07/04/2021 (Exact Date)   BMI 21.61 kg/m  CONSTITUTIONAL: Well-developed, well-nourished female in no acute distress.  HENT:  Normocephalic, atraumatic, External right and left ear normal. Oropharynx is clear and moist EYES: Conjunctivae and EOM are normal. Pupils are equal, round, and reactive to light. No scleral icterus.  NECK: Normal range of motion, supple, no masses.  Normal thyroid.  SKIN: Skin is warm and dry. No rash noted. Not diaphoretic. No erythema. No pallor. MUSCULOSKELETAL: Normal range of motion. No tenderness.  No cyanosis, clubbing, or edema.  2+ distal pulses. NEUROLOGIC: Alert and oriented to person, place, and time. Normal reflexes, muscle tone coordination.  PSYCHIATRIC: Normal mood and affect. Normal behavior. Normal judgment and thought content. CARDIOVASCULAR: Normal heart rate noted, regular rhythm RESPIRATORY: Clear to auscultation bilaterally. Effort  and breath sounds normal, no problems with respiration noted. BREASTS: Symmetric in size. No masses, tenderness, skin changes, nipple drainage, or lymphadenopathy bilaterally.  ABDOMEN: Soft, no distention noted.  No tenderness, rebound or guarding.  PELVIC: Normal appearing external genitalia and urethral meatus; normal appearing vaginal mucosa and cervix.  No abnormal discharge noted.  Pap smear obtained.  Contact bleeding. Normal uterine size, no  other palpable masses, no uterine or adnexal tenderness.  .   Assessment and Plan:    1. Well woman exam with routine gynecological exam  Pap:Will follow up results of pap smear and manage accordingly. Mammogram : n/a  Labs: none Refills: depo injections Referral: n/a  Routine preventative health maintenance measures emphasized. Please refer to After Visit Summary for other counseling recommendations.      Doreene Burke, CNM [facility],  [facility]

## 2021-07-30 LAB — CYTOLOGY - PAP: Diagnosis: NEGATIVE

## 2021-12-16 ENCOUNTER — Encounter: Payer: Self-pay | Admitting: Certified Nurse Midwife

## 2022-04-11 ENCOUNTER — Ambulatory Visit
Admission: RE | Admit: 2022-04-11 | Discharge: 2022-04-11 | Disposition: A | Discharge status: Home / Self Care | Payer: BC Managed Care – PPO | Source: Ambulatory Visit | Attending: Family Medicine | Admitting: Family Medicine

## 2022-04-11 VITALS — BP 127/87 | HR 93 | Temp 98.7°F | Resp 16

## 2022-04-11 DIAGNOSIS — L03011 Cellulitis of right finger: Secondary | ICD-10-CM

## 2022-04-11 MED ORDER — SULFAMETHOXAZOLE-TRIMETHOPRIM 800-160 MG PO TABS: 1.0000 | ORAL_TABLET | Freq: Two times a day (BID) | ORAL | 0 refills | Status: DC

## 2022-04-11 NOTE — ED Triage Notes (Signed)
Pt here with right index finger pain and swelling x 1 week.  ?

## 2022-04-11 NOTE — ED Provider Notes (Signed)
?[facility] ? ? ? ?CSN: [id number] ?Arrival date & time: 04/11/22  1854 ? ? ?  ? ?History   ?Chief Complaint ?Chief Complaint  ?Patient presents with  ? Finger Swelling  ? ? ?HPI ?Wanda Sanchez is a 31 y.o. female.  ? ?HPI ?Patient presents today with swelling of the right index finger x 1 week. ?Patient had a similar type of infection on her fourth digit and reports that she passed out during an attempt to I&D the paronychia.  Current paronychia has not drained although is increasingly tender.  She has not gotten any professional manicures but reports she frequently bites her nails.  She is afebrile. ? ?History reviewed. No pertinent past medical history. ? ?Patient Active Problem List  ? Diagnosis Date Noted  ? History of anemia 07/25/2019  ? Depression 09/03/2018  ? ? ?Past Surgical History:  ?Procedure Laterality Date  ? NO PAST SURGERIES    ? ? ?OB History   ? ? Gravida  ?2  ? Para  ?2  ? Term  ?2  ? Preterm  ?   ? AB  ?   ? Living  ?2  ?  ? ? SAB  ?   ? IAB  ?   ? Ectopic  ?   ? Multiple  ?0  ? Live Births  ?2  ?   ?  ?  ? ? ? ?Home Medications   ? ?Prior to Admission medications   ?Medication Sig Start Date End Date Taking? Authorizing Provider  ?sulfamethoxazole-trimethoprim (BACTRIM DS) 800-160 MG tablet Take 1 tablet by mouth 2 (two) times daily. 04/11/22  Yes Bing Neighbors, FNP  ?medroxyPROGESTERone (DEPO-PROVERA) 150 MG/ML injection Inject 1 mL (150 mg total) into the muscle every 3 (three) months. 07/26/21   Doreene Burke, CNM  ? ? ?Family History ?Family History  ?Problem Relation Age of Onset  ? Breast cancer Maternal Aunt   ? Breast cancer Paternal Aunt   ? Cancer Maternal Grandfather   ? Healthy Mother   ? Healthy Father   ? Ovarian cancer Neg Hx   ? Colon cancer Neg Hx   ? ? ?Social History ?Social History  ? ?Tobacco Use  ? Smoking status: Never  ? Smokeless tobacco: Never  ?Vaping Use  ? Vaping Use: Never used  ?Substance Use Topics  ? Alcohol use: Not Currently  ?  Comment:  occasional   ? Drug use: Never  ? ? ? ?Allergies   ?Patient has no known allergies. ? ? ?Review of Systems ?Review of Systems ?Pertinent negatives listed in HPI  ? ?Physical Exam ?Triage Vital Signs ?ED Triage Vitals  ?Enc Vitals Group  ?   BP 04/11/22 1913 127/87  ?   Pulse Rate 04/11/22 1913 93  ?   Resp 04/11/22 1913 16  ?   Temp 04/11/22 1913 98.7 ?F (37.1 ?C)  ?   Temp Source 04/11/22 1913 Oral  ?   SpO2 04/11/22 1913 100 %  ?   Weight --   ?   Height --   ?   Head Circumference --   ?   Peak Flow --   ?   Pain Score 04/11/22 1919 8  ?   Pain Loc --   ?   Pain Edu? --   ?   Excl. in GC? --   ? ?No data found. ? ?Updated Vital Signs ?BP 127/87 (BP Location: Left Arm)   Pulse 93   Temp 98.7 ?F (  37.1 ?C) (Oral)   Resp 16   SpO2 100%  ? ?Visual Acuity ?Right Eye Distance:   ?Left Eye Distance:   ?Bilateral Distance:   ? ?Right Eye Near:   ?Left Eye Near:    ?Bilateral Near:    ? ?Physical Exam ?Vitals reviewed.  ?Constitutional:   ?   Appearance: Normal appearance.  ?Cardiovascular:  ?   Rate and Rhythm: Normal rate and regular rhythm.  ?Pulmonary:  ?   Effort: Pulmonary effort is normal.  ?   Breath sounds: Normal breath sounds.  ?Musculoskeletal:  ?     Arms: ? ?Skin: ?   General: Skin is warm and dry.  ?Neurological:  ?   Mental Status: She is alert.  ? ? ? ?UC Treatments / Results  ?Labs ?(all labs ordered are listed, but only abnormal results are displayed) ?Labs Reviewed - No data to display ? ?EKG ? ? ?Radiology ?No results found. ? ?Procedures ?Procedures (including critical care time) ? ?Medications Ordered in UC ?Medications - No data to display ? ?Initial Impression / Assessment and Plan / UC Course  ?I have reviewed the triage vital signs and the nursing notes. ? ?Pertinent labs & imaging results that were available during my care of the patient were reviewed by me and considered in my medical decision making (see chart for details). ? ?  ?Discussed the benefits and risks of a possible I&D. ?Advised  that due to the areas nonfluctuating decrease likelihood of successfully been able to drain.  Discussed placing on antibiotics, warm salt water soaks and returning in 5 to 7 days if swelling and redness has not significantly improved.  Patient was in agreement with plan and verbalized understanding. ?Final Clinical Impressions(s) / UC Diagnoses  ? ?Final diagnoses:  ?Paronychia of right index finger  ? ? ? ?Discharge Instructions   ? ?  ?If swelling or redness worsens over the course of the next 5 to 7 days return here for evaluation and possible incision and draining of the paronychia.  If symptoms completely resolved no additional intervention warranted.  I would like for you to soak your finger in warm water and Epsom salt at least 3 times daily as needed.  Complete entire course of antibiotics. ? ? ? ? ?ED Prescriptions   ? ? Medication Sig Dispense Auth. Provider  ? sulfamethoxazole-trimethoprim (BACTRIM DS) 800-160 MG tablet Take 1 tablet by mouth 2 (two) times daily. 20 tablet Bing Neighbors, FNP  ? ?  ? ?PDMP not reviewed this encounter. ?  ?Bing Neighbors, FNP ?04/11/22 2013 ? ?

## 2022-04-11 NOTE — Discharge Instructions (Signed)
If swelling or redness worsens over the course of the next 5 to 7 days return here for evaluation and possible incision and draining of the paronychia.  If symptoms completely resolved no additional intervention warranted.  I would like for you to soak your finger in warm water and Epsom salt at least 3 times daily as needed.  Complete entire course of antibiotics. ?

## 2022-08-02 ENCOUNTER — Encounter: Payer: Self-pay | Admitting: Certified Nurse Midwife

## 2022-08-02 ENCOUNTER — Ambulatory Visit (INDEPENDENT_AMBULATORY_CARE_PROVIDER_SITE_OTHER): Payer: BC Managed Care – PPO | Admitting: Certified Nurse Midwife

## 2022-08-02 VITALS — BP 105/72 | HR 77 | Ht 68.0 in | Wt 142.5 lb

## 2022-08-02 DIAGNOSIS — Z01419 Encounter for gynecological examination (general) (routine) without abnormal findings: Secondary | ICD-10-CM | POA: Diagnosis not present

## 2022-08-02 MED ORDER — MEDROXYPROGESTERONE ACETATE 150 MG/ML IM SUSP: 150.0000 mg | INTRAMUSCULAR | 3 refills | Status: DC

## 2022-08-02 NOTE — Progress Notes (Signed)
GYNECOLOGY ANNUAL PREVENTATIVE CARE ENCOUNTER NOTE  History:     Wanda Sanchez is a 31 y.o. G60P2002 female here for a routine annual gynecologic exam.  Current complaints: none.   Denies abnormal vaginal bleeding, discharge, pelvic pain, problems with intercourse or other gynecologic concerns.     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 Patient has had an injection. Contraception: Depo-Provera injections Last Pap: 07/26/2021. Results were: normal  Last mammogram: n/a .   Upstream - 08/02/22 0841       Pregnancy Intention Screening   Does the patient want to become pregnant in the next year? No    Does the patient's partner want to become pregnant in the next year? No            The pregnancy intention screening data noted above was reviewed. Potential methods of contraception were discussed. The patient elected to proceed with Depo.   Obstetric History OB History  Gravida Para Term Preterm AB Living  2 2 2     2   SAB IAB Ectopic Multiple Live Births        0 2    # Outcome Date GA Lbr Len/2nd Weight Sex Delivery Anes PTL Lv  2 Term 10/16/19 [redacted]w[redacted]d / 00:26 8 lb 9.6 oz (3.9 kg) M Vag-Vacuum None  LIV  1 Term 2017 [redacted]w[redacted]d  7 lb 6.9 oz (3.371 kg) F Vag-Spont  N LIV    No past medical history on file.  Past Surgical History:  Procedure Laterality Date   NO PAST SURGERIES      Current Outpatient Medications on File Prior to Visit  Medication Sig Dispense Refill   medroxyPROGESTERone (DEPO-PROVERA) 150 MG/ML injection Inject 1 mL (150 mg total) into the muscle every 3 (three) months. 1 mL 3   sulfamethoxazole-trimethoprim (BACTRIM DS) 800-160 MG tablet Take 1 tablet by mouth 2 (two) times daily. 20 tablet 0   No current facility-administered medications on file prior to visit.    No Known  Allergies  Social History:  reports that she has never smoked. She has never used smokeless tobacco. She reports that she does not currently use alcohol. She reports that she does not use drugs.  Family History  Problem Relation Age of Onset   Breast cancer Maternal Aunt    Breast cancer Paternal Aunt    Cancer Maternal Grandfather    Healthy Mother    Healthy Father    Ovarian cancer Neg Hx    Colon cancer Neg Hx     The following portions of the patient's history were reviewed and updated as appropriate: allergies, current medications, past family history, past medical history, past social history, past surgical history and problem list.  Review of Systems Pertinent items noted in HPI and remainder of comprehensive ROS otherwise negative.  Physical Exam:  BP 105/72   Pulse 77   Ht 5\' 8"  (1.727 m)   Wt 142 lb 8 oz (64.6 kg)   LMP  (LMP Unknown)   BMI 21.67 kg/m  CONSTITUTIONAL: Well-developed, well-nourished female in no acute distress.  HENT:  Normocephalic, atraumatic, External right and left ear normal. Oropharynx is clear and moist EYES: Conjunctivae and EOM are normal. Pupils are equal, round, and reactive to light. No scleral icterus.  NECK: Normal range of motion, supple, no masses.  Normal thyroid.  SKIN:  Skin is warm and dry. No rash noted. Not diaphoretic. No erythema. No pallor. MUSCULOSKELETAL: Normal range of motion. No tenderness.  No cyanosis, clubbing, or edema.  2+ distal pulses. NEUROLOGIC: Alert and oriented to person, place, and time. Normal reflexes, muscle tone coordination.  PSYCHIATRIC: Normal mood and affect. Normal behavior. Normal judgment and thought content. CARDIOVASCULAR: Normal heart rate noted, regular rhythm RESPIRATORY: Clear to auscultation bilaterally. Effort and breath sounds normal, no problems with respiration noted. BREASTS: Symmetric in size. No masses, tenderness, skin changes, nipple drainage, or lymphadenopathy bilaterally.   ABDOMEN: Soft, no distention noted.  No tenderness, rebound or guarding.  PELVIC: Normal appearing external genitalia and urethral meatus; normal appearing vaginal mucosa and cervix.  No abnormal discharge noted.  Pap smear not due.  Normal uterine size, no other palpable masses, no uterine or adnexal tenderness.  .   Assessment and Plan:    1. Well woman exam with routine gynecological exam  Pap: not due Mammogram : n/a  Labs: declines Refills: depo Referral: none Routine preventative health maintenance measures emphasized. Please refer to After Visit Summary for other counseling recommendations.      Doreene Burke, CNM [facility],  [facility]

## 2022-11-02 ENCOUNTER — Other Ambulatory Visit: Payer: Self-pay | Admitting: Certified Nurse Midwife

## 2023-02-22 ENCOUNTER — Ambulatory Visit (LOCAL_COMMUNITY_HEALTH_CENTER): Payer: Self-pay

## 2023-02-22 DIAGNOSIS — Z719 Counseling, unspecified: Secondary | ICD-10-CM

## 2023-02-22 DIAGNOSIS — Z23 Encounter for immunization: Secondary | ICD-10-CM

## 2023-02-22 NOTE — Progress Notes (Signed)
Client seen in nurse clinic for Rabies vaccine.  Vaccine administered and tolerated well.  Tyriek Hofman Shelda Pal, RN

## 2023-02-24 ENCOUNTER — Ambulatory Visit: Payer: BC Managed Care – PPO

## 2023-03-01 ENCOUNTER — Ambulatory Visit (LOCAL_COMMUNITY_HEALTH_CENTER): Payer: Self-pay

## 2023-03-01 DIAGNOSIS — Z23 Encounter for immunization: Secondary | ICD-10-CM

## 2023-03-01 DIAGNOSIS — Z719 Counseling, unspecified: Secondary | ICD-10-CM

## 2023-03-01 NOTE — Progress Notes (Signed)
In nurse clinic for Rabies vaccine #2. Vaccine given and tolerated well today. Updated [facility] copy given. Josie Saunders, RN

## 2023-03-03 ENCOUNTER — Ambulatory Visit: Payer: BC Managed Care – PPO

## 2023-04-26 ENCOUNTER — Ambulatory Visit
Admission: RE | Admit: 2023-04-26 | Discharge: 2023-04-26 | Disposition: A | Discharge status: Home / Self Care | Payer: BC Managed Care – PPO | Source: Ambulatory Visit | Attending: Family Medicine | Admitting: Family Medicine

## 2023-04-26 VITALS — BP 101/74 | HR 83 | Temp 98.0°F | Ht 68.0 in | Wt 142.0 lb

## 2023-04-26 DIAGNOSIS — N3001 Acute cystitis with hematuria: Secondary | ICD-10-CM

## 2023-04-26 LAB — URINALYSIS, W/ REFLEX TO CULTURE (INFECTION SUSPECTED)
Bilirubin Urine: NEGATIVE
Glucose, UA: NEGATIVE mg/dL
Ketones, ur: NEGATIVE mg/dL
Nitrite: POSITIVE — AB
Protein, ur: 100 mg/dL — AB
RBC / HPF: >50 RBC/hpf (ref 0–5)
Specific Gravity, Urine: 1.025 (ref 1.005–1.030)
WBC, UA: >50 WBC/hpf (ref 0–5)
pH: 6.5 (ref 5.0–8.0)

## 2023-04-26 LAB — URINE CULTURE

## 2023-04-26 MED ORDER — CEPHALEXIN 500 MG PO CAPS: 500.0000 mg | ORAL_CAPSULE | Freq: Four times a day (QID) | ORAL | 0 refills | Status: DC

## 2023-04-26 NOTE — Discharge Instructions (Addendum)
You have a urinary tract infection.  Start by the pharmacy and pick up your antibiotics and take as directed.

## 2023-04-26 NOTE — ED Provider Notes (Signed)
[facility]    CSN: [id number] Arrival date & time: 04/26/23  1044      History   Chief Complaint Chief Complaint  Patient presents with   Urinary Frequency     HPI HPI Wanda Sanchez is a 32 y.o. female.    [name] presents for dysuria with urinary frequency that started around 3 AM. Symptoms are getting worse. She noticed some hematuria.  Tried nothing prior to arrival.  Has  not had any antibiotics in last 30 days.   Denies known STI exposure. She is  not currently pregnant.  No LMP recorded. Patient has had an injection.  - Abnormal vaginal discharge: no - vaginal bleeding: no - Dysuria: yes - Hematuria: yes - Urinary urgency: yes - Urinary frequency: yes   - Fever: no - Abdominal pain: yes - Pelvic pain: no - Rash/Skin lesions/mouth ulcers: no - Nausea: no  - Vomiting: no  - Back Pain: yes         History reviewed. No pertinent past medical history.  Patient Active Problem List   Diagnosis Date Noted   History of anemia 07/25/2019   Depression 09/03/2018    Past Surgical History:  Procedure Laterality Date   NO PAST SURGERIES      OB History     Gravida  2   Para  2   Term  2   Preterm      AB      Living  2      SAB      IAB      Ectopic      Multiple  0   Live Births  2            Home Medications    Prior to Admission medications   Medication Sig Start Date End Date Taking? Authorizing Provider  cephALEXin (KEFLEX) 500 MG capsule Take 1 capsule (500 mg total) by mouth 4 (four) times daily. 04/26/23  Yes Genese Quebedeaux, DO  medroxyPROGESTERone (DEPO-PROVERA) 150 MG/ML injection Inject 1 mL (150 mg total) into the muscle every 3 (three) months. 08/02/22  Yes Doreene Burke, CNM    Family History Family History  Problem Relation Age of Onset   Breast cancer Maternal Aunt    Breast cancer Paternal Aunt    Cancer Maternal Grandfather    Healthy Mother    Healthy Father    Ovarian cancer Neg  Hx    Colon cancer Neg Hx     Social History Social History   Tobacco Use   Smoking status: Never   Smokeless tobacco: Never  Vaping Use   Vaping Use: Never used  Substance Use Topics   Alcohol use: Not Currently    Comment: occasional    Drug use: Never     Allergies   Patient has no known allergies.   Review of Systems Review of Systems: :negative unless otherwise stated in HPI.      Physical Exam Triage Vital Signs ED Triage Vitals  Enc Vitals Group     BP      Pulse      Resp      Temp      Temp src      SpO2      Weight      Height      Head Circumference      Peak Flow      Pain Score      Pain Loc  Pain Edu?      Excl. in GC?    No data found.  Updated Vital Signs BP 101/74 (BP Location: Left Arm)   Pulse 83   Temp 98 F (36.7 C) (Oral)   Ht 5\' 8"  (1.727 m)   Wt 64.4 kg   SpO2 100%   BMI 21.59 kg/m   Visual Acuity Right Eye Distance:   Left Eye Distance:   Bilateral Distance:    Right Eye Near:   Left Eye Near:    Bilateral Near:     Physical Exam GEN: well appearing female in no acute distress  CVS: well perfused  RESP: speaking in full sentences without pause  ABD: soft, suprapubic tenderness, non-distended, no palpable masses, no CVA tenderness   SKIN: warm and dry    UC Treatments / Results  Labs (all labs ordered are listed, but only abnormal results are displayed) Labs Reviewed  URINALYSIS, W/ REFLEX TO CULTURE (INFECTION SUSPECTED) - Abnormal; Notable for the following components:      Result Value   APPearance HAZY (*)    Hgb urine dipstick LARGE (*)    Protein, ur 100 (*)    Nitrite POSITIVE (*)    Leukocytes,Ua SMALL (*)    Non Squamous Epithelial PRESENT (*)    Bacteria, UA MANY (*)    All other components within normal limits  URINE CULTURE    EKG   Radiology No results found.  Procedures Procedures (including critical care time)  Medications Ordered in UC Medications - No data to  display  Initial Impression / Assessment and Plan / UC Course  I have reviewed the triage vital signs and the nursing notes.  Pertinent labs & imaging results that were available during my care of the patient were reviewed by me and considered in my medical decision making (see chart for details).     Acute cystitis:  Patient is a 32 y.o. female  who presents for acute onset dysuria and hematuria. Overall patient is well-appearing and afebrile.  Vital signs stable.  UA consistent with acute cystitis. Hematuria supported on microscopy.  Treat with Keflex 4 times daily for 5 days.  Urine culture obtained.  Follow-up sensitivities and change antibiotics, if needed.  -Follow-up if symptoms not improving or getting worse - Return precautions including abdominal pain, fever, chills, nausea, or vomiting given.   Discussed MDM, treatment plan and plan for follow-up with patient who agrees with plan.        Final Clinical Impressions(s) / UC Diagnoses   Final diagnoses:  Acute cystitis with hematuria     Discharge Instructions      You have a urinary tract infection.  Start by the pharmacy and pick up your antibiotics and take as directed.     ED Prescriptions     Medication Sig Dispense Auth. Provider   cephALEXin (KEFLEX) 500 MG capsule Take 1 capsule (500 mg total) by mouth 4 (four) times daily. 20 capsule Katha Cabal, DO      PDMP not reviewed this encounter.   Katha Cabal, DO 04/26/23 1158

## 2023-04-26 NOTE — ED Triage Notes (Signed)
Pt states she woke up at 3am this morning noticed some dysuria and some hematuria. Pt states stinging much worse throughout day, frequency.

## 2023-04-28 LAB — URINE CULTURE

## 2023-05-25 ENCOUNTER — Other Ambulatory Visit: Payer: Self-pay | Admitting: Certified Nurse Midwife

## 2023-05-26 MED ORDER — MEDROXYPROGESTERONE ACETATE 150 MG/ML IM SUSP: 150.0000 mg | INTRAMUSCULAR | 1 refills | Status: DC

## 2023-05-26 NOTE — Addendum Note (Signed)
Addended by: Loran Senters D on: 05/26/2023 02:01 PM   Modules accepted: Orders

## 2023-05-26 NOTE — Telephone Encounter (Signed)
Pt calling; depo denied; gives herself the injection; annual due in Aug.  Adv will refill; to ck c H.T. later.

## 2023-09-13 ENCOUNTER — Encounter: Payer: Self-pay | Admitting: Certified Nurse Midwife

## 2023-09-13 ENCOUNTER — Ambulatory Visit: Payer: BC Managed Care – PPO | Admitting: Certified Nurse Midwife

## 2023-09-13 VITALS — BP 110/73 | HR 80 | Ht 68.0 in | Wt 147.6 lb

## 2023-09-13 DIAGNOSIS — Z01419 Encounter for gynecological examination (general) (routine) without abnormal findings: Secondary | ICD-10-CM | POA: Diagnosis not present

## 2023-09-13 MED ORDER — MEDROXYPROGESTERONE ACETATE 150 MG/ML IM SUSP: 150.0000 mg | INTRAMUSCULAR | 3 refills | Status: DC

## 2023-09-13 NOTE — Patient Instructions (Signed)

## 2023-09-13 NOTE — Progress Notes (Signed)
GYNECOLOGY ANNUAL PREVENTATIVE CARE ENCOUNTER NOTE  History:     Bette Swatzell is a 32 y.o. G11P2002 female here for a routine annual gynecologic exam.  Current complaints: none.   Denies abnormal vaginal bleeding, discharge, pelvic pain, problems with intercourse or other gynecologic concerns.     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   Gynecologic History No LMP recorded. Patient has had an injection. Contraception: Depo-Provera injections Last Pap: 07/26/2021. Results were: normal  Last mammogram: n/a.    Upstream - 09/13/23 0831       Pregnancy Intention Screening   Does the patient want to become pregnant in the next year? No    Does the patient's partner want to become pregnant in the next year? No    Would the patient like to discuss contraceptive options today? No      Contraception Wrap Up   Current Method Hormonal Injection    End Method Hormonal Injection    Contraception Counseling Provided No            The pregnancy intention screening data noted above was reviewed. Potential methods of contraception were discussed. The patient elected to proceed with Hormonal Injection.   Obstetric History OB History  Gravida Para Term Preterm AB Living  2 2 2     2   SAB IAB Ectopic Multiple Live Births        0 2    # Outcome Date GA Lbr Len/2nd Weight Sex Type Anes PTL Lv  2 Term 10/16/19 [redacted]w[redacted]d / 00:26 8 lb 9.6 oz (3.9 kg) M Vag-Vacuum None  LIV  1 Term 2017 [redacted]w[redacted]d  7 lb 6.9 oz (3.371 kg) F Vag-Spont  N LIV    No past medical history on file.  Past Surgical History:  Procedure Laterality Date   NO PAST SURGERIES      Current Outpatient Medications on File Prior to Visit  Medication Sig Dispense Refill   medroxyPROGESTERone (DEPO-PROVERA) 150 MG/ML injection Inject 1 mL (150 mg total) into the muscle every 3  (three) months. 1 mL 1   No current facility-administered medications on file prior to visit.    No Known Allergies  Social History:  reports that she has never smoked. She has never used smokeless tobacco. She reports that she does not currently use alcohol. She reports that she does not use drugs.  Family History  Problem Relation Age of Onset   Breast cancer Maternal Aunt    Breast cancer Paternal Aunt    Cancer Maternal Grandfather    Healthy Mother    Healthy Father    Ovarian cancer Neg Hx    Colon cancer Neg Hx     The following portions of the patient's history were reviewed and updated as appropriate: allergies, current medications, past family history, past medical history, past social history, past surgical history and problem list.  Review of Systems Pertinent items noted in HPI and remainder of comprehensive ROS otherwise negative.  Physical Exam:  BP 110/73   Pulse 80   Ht 5\' 8"  (1.727 m)   Wt 147 lb 9.6 oz (67 kg)   BMI 22.44 kg/m  CONSTITUTIONAL: Well-developed, well-nourished female in no acute distress.  HENT:  Normocephalic, atraumatic, External right and left ear normal. Oropharynx is clear and moist EYES: Conjunctivae and EOM are normal. Pupils are equal, round,  and reactive to light. No scleral icterus.  NECK: Normal range of motion, supple, no masses.  Normal thyroid.  SKIN: Skin is warm and dry. No rash noted. Not diaphoretic. No erythema. No pallor. MUSCULOSKELETAL: Normal range of motion. No tenderness.  No cyanosis, clubbing, or edema.  2+ distal pulses. NEUROLOGIC: Alert and oriented to person, place, and time. Normal reflexes, muscle tone coordination.  PSYCHIATRIC: Normal mood and affect. Normal behavior. Normal judgment and thought content. CARDIOVASCULAR: Normal heart rate noted, regular rhythm RESPIRATORY: Clear to auscultation bilaterally. Effort and breath sounds normal, no problems with respiration noted. BREASTS: Symmetric in size. No  masses, tenderness, skin changes, nipple drainage, or lymphadenopathy bilaterally.  ABDOMEN: Soft, no distention noted.  No tenderness, rebound or guarding.  PELVIC: Normal appearing external genitalia and urethral meatus; normal appearing vaginal mucosa and cervix.  No abnormal discharge noted.  Pap smear not due. Normal uterine size, no other palpable masses, no uterine or adnexal tenderness.  .   Assessment and Plan:    1. Women's annual routine gynecological examination  Pap: not due  Mammogram : n/a  Labs: none Refills: depo injection refill Referral: none Routine preventative health maintenance measures emphasized. Please refer to After Visit Summary for other counseling recommendations.      Doreene Burke, CNM  OB/GYN  [facility],  [facility]

## 2023-10-27 ENCOUNTER — Other Ambulatory Visit: Payer: Self-pay | Admitting: Certified Nurse Midwife

## 2024-10-21 ENCOUNTER — Other Ambulatory Visit: Payer: Self-pay | Admitting: Certified Nurse Midwife

## 2024-11-19 ENCOUNTER — Encounter: Payer: Self-pay | Admitting: Certified Nurse Midwife

## 2024-11-19 ENCOUNTER — Ambulatory Visit: Admitting: Certified Nurse Midwife

## 2024-11-19 VITALS — BP 104/68 | HR 76 | Ht 69.0 in | Wt 154.4 lb

## 2024-11-19 DIAGNOSIS — Z01419 Encounter for gynecological examination (general) (routine) without abnormal findings: Secondary | ICD-10-CM

## 2024-11-19 MED ORDER — MEDROXYPROGESTERONE ACETATE 150 MG/ML IM SUSY: 1.0000 mL | PREFILLED_SYRINGE | INTRAMUSCULAR | 3 refills | Status: DC

## 2024-11-19 NOTE — Progress Notes (Signed)
 GYNECOLOGY ANNUAL PREVENTATIVE CARE ENCOUNTER NOTE  History:     Wanda Sanchez is a 33 y.o. G84P2002 female here for a routine annual gynecologic exam.  Current complaints: none.   Denies abnormal vaginal bleeding, discharge, pelvic pain, problems with intercourse or other gynecologic concerns.     Social Relationship:Married Living:husband and two children Work:working from home Exercise:2-3x a week Smoke/Alcohol/drug use:No/ Occasional/No  Gynecologic History No LMP recorded. Patient has had an injection. Contraception: Depo-Provera  injections Last Pap: 07/26/2021. Results were: normal with negative HPV Last mammogram: N/A. Results were: N/A  Obstetric History OB History  Gravida Para Term Preterm AB Living  2 2 2   2   SAB IAB Ectopic Multiple Live Births     0 2    # Outcome Date GA Lbr Len/2nd Weight Sex Type Anes PTL Lv  2 Term 10/16/19 [redacted]w[redacted]d / 00:26 8 lb 9.6 oz (3.9 kg) M Vag-Vacuum None  LIV  1 Term 2017 [redacted]w[redacted]d  7 lb 6.9 oz (3.371 kg) F Vag-Spont  N LIV    History reviewed. No pertinent past medical history.  Past Surgical History:  Procedure Laterality Date   NO PAST SURGERIES      No current outpatient medications on file prior to visit.   No current facility-administered medications on file prior to visit.    No Known Allergies  Social History:  reports that she has never smoked. She has never used smokeless tobacco. She reports that she does not currently use alcohol. She reports that she does not use drugs.  Family History  Problem Relation Age of Onset   Breast cancer Maternal Aunt    Breast cancer Paternal Aunt    Cancer Maternal Grandfather    Healthy Mother    Healthy Father    Ovarian cancer Neg Hx    Colon cancer Neg Hx     The following portions of the patient's history were reviewed and updated as appropriate: allergies, current medications, past family history, past medical history, past social history, past surgical history  and problem list.  Review of Systems Pertinent items noted in HPI and remainder of comprehensive ROS otherwise negative.  Physical Exam:  BP 104/68   Pulse 76   Ht 5' 9 (1.753 m)   Wt 154 lb 6.4 oz (70 kg)   BMI 22.80 kg/m  CONSTITUTIONAL: Well-developed, well-nourished female in no acute distress.  HENT:  Normocephalic, atraumatic, External right and left ear normal. Oropharynx is clear and moist EYES: Conjunctivae and EOM are normal. Pupils are equal, round, and reactive to light. No scleral icterus.  NECK: Normal range of motion, supple, no masses.  Normal thyroid.  SKIN: Skin is warm and dry. No rash noted. Not diaphoretic. No erythema. No pallor. MUSCULOSKELETAL: Normal range of motion. No tenderness.  No cyanosis, clubbing, or edema.  2+ distal pulses. NEUROLOGIC: Alert and oriented to person, place, and time. Normal reflexes, muscle tone coordination.  PSYCHIATRIC: Normal mood and affect. Normal behavior. Normal judgment and thought content. CARDIOVASCULAR: Normal heart rate noted, regular rhythm RESPIRATORY: Clear to auscultation bilaterally. Effort and breath sounds normal, no problems with respiration noted. BREASTS: Symmetric in size. No masses, tenderness, skin changes, nipple drainage, or lymphadenopathy bilaterally.  ABDOMEN: Soft, no distention noted.  No tenderness, rebound or guarding.  PELVIC: Normal appearing external genitalia and urethral meatus; normal appearing vaginal mucosa and cervix.  No abnormal discharge noted.  Pap smear not due.  Normal uterine size, no other palpable masses,  no uterine or adnexal tenderness.  .   Assessment and Plan:  Annual Well Women GYN Exam   Pap: not due Mammogram : not due  Labs: none  Refills:Depo provera   Referral: none  Routine preventative health maintenance measures emphasized. Please refer to After Visit Summary for other counseling recommendations.      Zelda Hummer, CNM [facility]  [facility],  [facility]

## 2024-11-19 NOTE — Patient Instructions (Signed)

## 2025-01-16 ENCOUNTER — Other Ambulatory Visit: Payer: Self-pay | Admitting: Certified Nurse Midwife
